# Patient Record
Sex: Male | Born: 1965 | Race: Black or African American | Hispanic: No | Smoking: Never smoker
Health system: Southern US, Community
[De-identification: ages and names within clinical notes are randomized; demographics above are authoritative.]

## PROBLEM LIST (undated history)

## (undated) DIAGNOSIS — I639 Cerebral infarction, unspecified: Secondary | ICD-10-CM

## (undated) DIAGNOSIS — E7800 Pure hypercholesterolemia, unspecified: Secondary | ICD-10-CM

---

## 2004-03-17 ENCOUNTER — Emergency Department (HOSPITAL_COMMUNITY): Admission: EM | Admit: 2004-03-17 | Discharge: 2004-03-18 | Payer: Self-pay | Admitting: Emergency Medicine

## 2004-07-19 ENCOUNTER — Ambulatory Visit: Payer: Self-pay | Admitting: Cardiology

## 2004-07-19 ENCOUNTER — Encounter: Payer: Self-pay | Admitting: Emergency Medicine

## 2004-07-19 ENCOUNTER — Inpatient Hospital Stay (HOSPITAL_COMMUNITY): Admission: AD | Admit: 2004-07-19 | Discharge: 2004-07-23 | Payer: Self-pay | Admitting: Neurology

## 2004-07-20 ENCOUNTER — Encounter: Payer: Self-pay | Admitting: Cardiology

## 2004-07-30 ENCOUNTER — Encounter: Admission: RE | Admit: 2004-07-30 | Discharge: 2004-09-06 | Payer: Self-pay | Admitting: Neurology

## 2005-07-11 ENCOUNTER — Emergency Department (HOSPITAL_COMMUNITY): Admission: EM | Admit: 2005-07-11 | Discharge: 2005-07-11 | Payer: Self-pay | Admitting: Family Medicine

## 2009-10-18 ENCOUNTER — Emergency Department (HOSPITAL_COMMUNITY): Admission: EM | Admit: 2009-10-18 | Discharge: 2009-10-18 | Payer: Self-pay | Admitting: Emergency Medicine

## 2010-11-09 NOTE — H&P (Signed)
NAMEGREGORY, Zavala NO.:  [id number]   MEDICAL RECORD NO.:  [id number]          PATIENT TYPE:  EMS   LOCATION:  ED                           FACILITY:  [facility]   PHYSICIAN:  Gustavus Messing. Orlin Hilding, M.D.DATE OF BIRTH:  Dec 20, 1965   DATE OF ADMISSION:  07/19/2004  DATE OF DISCHARGE:                                HISTORY & PHYSICAL   CHIEF COMPLAINT:  Confusion, speech disturbance.   HISTORY OF PRESENT ILLNESS:  [name] 45 year old, right-handed, black male  without any known medical problems. He woke up somewhere between 3 a.m. and  4 a.m. to go to the bathroom, but apparently urinated on the floor, fell a  few times and seemed confused.  I do not know whether his wife was clearly  aware of what was going on.  At any rate he went back to bed.  This morning  on awakening, he still seemed confused with shaking but not noted to be weak  particularly on one side or the other, although his wife felt his right face  was swollen.  He was eventually by family but not by EMS.  Not called as a  code stroke.   REVIEW OF SYSTEMS:  Denies headache, chest pain, shortness of breath, or  vision problems.   PAST MEDICAL HISTORY:  None known.   PAST SURGICAL HISTORY:  None.   MEDICATIONS:  None.   ALLERGIES:  No known drug allergies.   SOCIAL HISTORY:  He is married.  Works for [employer].  No cigarette use but  does use marijuana and alcohol.   FAMILY HISTORY:  Positive for stroke in his father.   PHYSICAL EXAMINATION:  VITAL SIGNS:  Temperature  98.4, pulse 70, blood  pressure 159/99, respirations 20, 98% saturation on room air.  HEENT:  Normocephalic, atraumatic.  NECK:  Supple without bruits.  HEART:  Regular rate and rhythm.  EXTREMITIES:  Without edema.  NEUROLOGIC:  Mental status:  He is awake and alert.  He is able to follow  most commands but not all.  He does demonstrate some apraxia.  He attempts  to speak.  He can get a few words out but there are a lot of  neologism and  paraphasic areas.  He really cannot communicate well.  He is unable to  repeat or name objects or name objects.  Cranial nerves:  Pupils are equal  and reactive.  Visual fields:  He cannot cooperate well but he definitely  blinks to the threat on the left but not on the right so I think he has a  right field cut.  Extraocular movements are intact, however.  Facial  sensation appears to be normal.  Facial motor activity demonstrates a right  central facial droop.  Hearing is intact.  Palate is symmetric and tongue is  midline. He is able to chew a cracker and swallow that with water without  choking or coughing.  Motor exam:  There is no drift in either upper  extremity or lower extremity.  He has good grip, good bulk, tone and  strength throughout, 5/5 strength, normal  rapid fine movements noted.  No  drift.  Reflexes are 2+ and symmetric.  Downgoing toes to plantar  stimulation.  Coordination:  Finger-to-nose, rapid alternating movements,  heel-to-shin are normal.  I did not have him ambulate. Sensory exam is  intact.   LABORATORY DATA:  CT scan shows hypodensity in the left insular region and  MRI confirms an acute stroke in the insular region on the left and MCA  branch.  MRA shows cutoff of the left MCA.  There is some ischemia in the  putamen and in the posterior limb of the internal capsule as well on that  side.  Labs thus far are unremarkable.  Urine drug screen was positive for  marijuana, negative for cocaine.  Normal CBC.   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  He has no other obvious risk factors.  He  has expressive greater than receptive aphasia, right field cut, right facial  weakness.  Etiology is unclear.   PLAN:  Since cardiac source is a possibility in this young man without other  medical history, I will  begin heparin pending outcome of work-up.  Admit to  stroke service, MRA of the neck, 2-D echocardiogram, transcranial Doppler,  SGOT, PT evaluation.      CAW/MEDQ  D:  07/19/2004  T:  07/19/2004  Job:  [id number]

## 2010-11-09 NOTE — Discharge Summary (Signed)
[name], [name]               ACCOUNT NO.:  [id number]   MEDICAL RECORD NO.:  [id number]          PATIENT TYPE:  INP   LOCATION:  3027                         FACILITY:  [facility]   PHYSICIAN:  Pramod P. Pearlean Brownie, MD    DATE OF BIRTH:  03/13/1966   DATE OF ADMISSION:  07/19/2004  DATE OF DISCHARGE:  07/23/2004                                 DISCHARGE SUMMARY   DISCHARGE DIAGNOSES:  1.  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  2.  Dyslipidemia.   DISCHARGE MEDICATIONS:  1.  Zocor 20 mg p.o. q. day.  2.  Aggrenox 1 p.o. q. day. x14 days, then increase to b.i.d.  3.  May take Tylenol 2 tablets 30 minutes prior to Aggrenox dose.   STUDIES PERFORMED:  1.  CT of the brain on admission shows subtle mild edema in the posterior      left frontal lobe, acute right maxillary sinusitis.  2.  MRI of the brain shows a left infracortex, nonhemorrhagic infarct.  3.  MRA of the brain:  Occluded distal left M1 segment MCA, hypoplastic left      vertebral artery, __________ origin, left PCA.  4.  Carotid Doppler shows no significant ICA stenosis.  Vertebral arteries      were antegrade.  5.  A 2D echocardiogram shows ejection fraction of 55-65% with no left      ventricular wall motion abnormalities.  6.  Transcranial Doppler performed.  Results pending.  7.  Transesophageal echocardiogram performed by Dr. Remington Bing showing      normal LV.  No valve abnormalities.  Normal left atrium.  No __________.      Nonthrombosed.  Normal aorta.  Contrast study negative.   LABORATORY STUDIES:  Hemoglobin A1C normal.  Homocysteine normal.  CBC  normal.  Sed rate normal.  Sickle cell screen negative.  Cholesterol 191,  triglycerides 82, HDL 71, LDL 104.   HISTORY OF PRESENT ILLNESS:  [name] is a 45 year old right-handed  black male with no significant medical history, who awoke at about 3:00 to  4:00 a.m. to go to the bathroom but urinated on the floor, fell a  few times,  then went back to bed.  That morning on awakening, he still seems confused  and was shaking but no weakness was noted.  His wife said his right face was  swollen, and he was brought to the emergency room by the family for  evaluation.  Neurology was called and felt that he had a stroke with aphasia  and was admitted to the hospital for evaluation.   HOSPITAL COURSE:  MRI did show an acute left infracortex infarct.  It was  felt to be embolic, although no etiology was found on hospital admission,  including a TEE.  A hypercoagulable workup was performed, except for studies  that would not be accurate, since the patient was on IV heparin.  Will need  to consider protein C and protein S, etc., in the future.  The patient did  have some very mild dyslipidemia with LDL of 104.  Was  placed on Zocor.  Otherwise, no other risk factors identified.  The most significant effect of  the stroke seems to be that of significant expressive aphasia and mild  receptive aphasia.  He will need outpatient speech therapy monitoring.   CONDITION ON DISCHARGE:  Patient is alert and oriented x 3.  Speech:  He has  significant expressive aphasia with word-finding difficulties with no  __________ repetition.  He has mild-to-moderate receptive aphasia.  He has  decreased ability to two-step commands.  His strength is normal.  Sensation  is intact.  No ataxia.  Chest is clear to auscultation.  Heart has a regular  rate and rhythm.   DISCHARGE PLAN:  1.  Discharge home with family.  2.  No working for now.  3.  Aggrenox for secondary stroke prevention.  4.  Outpatient speech therapy.  5.  Follow up cholesterol in 6-8 weeks.  6.  Get a primary physician to see them within a month.  7.  Follow up with Dr. Pearlean Brownie in two months.      SB/MEDQ  D:  07/23/2004  T:  07/23/2004  Job:  [id number]

## 2011-10-16 ENCOUNTER — Encounter (HOSPITAL_COMMUNITY): Payer: Self-pay | Admitting: *Deleted

## 2011-10-16 ENCOUNTER — Emergency Department (INDEPENDENT_AMBULATORY_CARE_PROVIDER_SITE_OTHER)
Admission: EM | Admit: 2011-10-16 | Discharge: 2011-10-16 | Disposition: A | Discharge status: Home / Self Care | Payer: BC Managed Care – PPO | Source: Home / Self Care | Attending: Emergency Medicine | Admitting: Emergency Medicine

## 2011-10-16 DIAGNOSIS — B009 Herpesviral infection, unspecified: Secondary | ICD-10-CM

## 2011-10-16 DIAGNOSIS — B001 Herpesviral vesicular dermatitis: Secondary | ICD-10-CM

## 2011-10-16 HISTORY — DX: Cerebral infarction, unspecified: I63.9

## 2011-10-16 MED ORDER — ACYCLOVIR 400 MG PO TABS: 400.0000 mg | ORAL_TABLET | Freq: Three times a day (TID) | ORAL | Status: AC

## 2011-10-16 MED ORDER — DOCOSANOL 10 % EX CREA: TOPICAL_CREAM | CUTANEOUS | Status: DC

## 2011-10-16 NOTE — ED Provider Notes (Signed)
History     CSN: [id number]  Arrival date & time 10/16/11  1134   First MD Initiated Contact with Patient 10/16/11 1252      Chief Complaint  Patient presents with  . Mouth Lesions    (Consider location/radiation/quality/duration/timing/severity/associated sxs/prior treatment) HPI Comments: Rash tender and mildly itchy on R upper lip, have had this before but a long time ago" I alos wan to be tested for HIV  Patient is a 46 y.o. male presenting with mouth sores. The history is provided by the patient.  Mouth Lesions  The current episode started 2 days ago. The problem occurs rarely. The problem has been gradually worsening. The problem is mild. The symptoms are relieved by nothing. Associated symptoms include mouth sores. Pertinent negatives include no fever, no sore throat, no swollen glands, no muscle aches, no cough and no URI.    Past Medical History  Diagnosis Date  . Stroke     History reviewed. No pertinent past surgical history.  No family history on file.  History  Substance Use Topics  . Smoking status: Never Smoker   . Smokeless tobacco: Not on file  . Alcohol Use: Yes      Review of Systems  Constitutional: Negative for fever and chills.  HENT: Positive for mouth sores. Negative for sore throat.   Respiratory: Negative for cough.     Allergies  Review of patient's allergies indicates not on file.  Home Medications   Current Outpatient Rx  Name Route Sig Dispense Refill  . SIMVASTATIN PO Oral Take by mouth every morning.    . ACYCLOVIR 400 MG PO TABS Oral Take 1 tablet (400 mg total) by mouth 3 (three) times daily. 50 tablet 3  . DOCOSANOL 10 % EX CREA  Apply up to 5 times per day- maximun 10 days 2 g 0    BP 114/81  Pulse 68  Temp(Src) 98.2 F (36.8 C) (Oral)  Resp 18  SpO2 99%  Physical Exam  Nursing note and vitals reviewed. Constitutional: He appears well-developed and well-nourished.  HENT:  Head: Normocephalic.  Mouth/Throat:  Uvula is midline, oropharynx is clear and moist and mucous membranes are normal. No oropharyngeal exudate.    Eyes: Conjunctivae are normal. Right eye exhibits no discharge. Left eye exhibits no discharge.  Lymphadenopathy:    He has no cervical adenopathy.  Psychiatric: He has a normal mood and affect. His behavior is normal. Judgment and thought content normal.    ED Course  Procedures (including critical care time)   Labs Reviewed  HIV ANTIBODY (ROUTINE TESTING)  RPR   No results found.   1. Herpes labialis       MDM  Upper lip vesicular-erythematous eruption (not first episode per patient) Patient explains also wants to be tested for HIV        Jimmie Molly, MD 10/16/11 1756

## 2011-10-16 NOTE — Discharge Instructions (Signed)
Herpes Labialis  You have a fever blister or cold sore (herpes labialis). These painful, grouped sores are caused by one of the herpes viruses (HSV1 most commonly). They are usually found around the lips and mouth, but the same infection can also affect other areas on the face such as the nose and eyes. Herpes infections take about 10 days to heal. They often occur again and again in the same spot. Other symptoms may include numbness and tingling in the involved skin, achiness, fever, and swollen glands in the neck. Colds, emotional stress, injuries, or excess sunlight exposure all seem to make herpes reappear. Herpes lip infections are contagious. Direct contact with these sores can spread the infection. It can also be spread to other parts of your own body.  TREATMENT   Herpes labialis is usually self-limited and resolves within 1 week. To reduce pain and swelling, apply ice packs frequently to the sores or suck on popsicles or frozen juice bars. Antiviral medicine may be used by mouth to shorten the duration of the breakout. Avoid spreading the infection by washing your hands often. Be careful not to touch your eyes or genital areas after handling the infected blisters. Do not kiss or have other intimate contact with others. After the blisters are completely healed you may resume contact. Use sunscreen to lessen recurrences.   If this is your first infection with herpes, or if you have a severe or repeated infections, your caregiver may prescribe one of the anti-viral drugs to speed up the healing. If you have sun-related flare-ups despite the use of sunscreen, starting oral anti-viral medicine before a prolonged exposure (going skiing or to the beach) can prevent most episodes.   SEEK IMMEDIATE MEDICAL CARE IF:   You develop a headache, sleepiness, high fever, vomiting, or severe weakness.   You have eye irritation, pain, blurred vision or redness.   You develop a prolonged infection not getting better in 10  days.  Document Released: 06/10/2005 Document Revised: 05/30/2011 Document Reviewed: 04/14/2009  [employer] Patient Information 2012 [employer].

## 2011-10-16 NOTE — ED Notes (Signed)
Pt  Has  A  Lesion   On  His  r  Lip   X  2  Days  He  Reports   He  Woke  Up  With the  Symptom        denys  Any other  Symptoms

## 2011-10-30 ENCOUNTER — Telehealth (HOSPITAL_COMMUNITY): Payer: Self-pay | Admitting: *Deleted

## 2011-10-30 NOTE — ED Notes (Signed)
Pt. called for his lab results. Pt. verified x 2 and given neg. RPR result. Pt. told he would have to come between 1130-1800 M-F to see HIV result. Vassie Moselle 10/30/2011

## 2012-01-26 ENCOUNTER — Emergency Department (HOSPITAL_COMMUNITY): Payer: BC Managed Care – PPO

## 2012-01-26 ENCOUNTER — Emergency Department (HOSPITAL_COMMUNITY)
Admission: EM | Admit: 2012-01-26 | Discharge: 2012-01-26 | Disposition: A | Discharge status: Home / Self Care | Payer: BC Managed Care – PPO | Attending: Emergency Medicine | Admitting: Emergency Medicine

## 2012-01-26 ENCOUNTER — Encounter (HOSPITAL_COMMUNITY): Payer: Self-pay | Admitting: Emergency Medicine

## 2012-01-26 ENCOUNTER — Emergency Department (INDEPENDENT_AMBULATORY_CARE_PROVIDER_SITE_OTHER)
Admission: EM | Admit: 2012-01-26 | Discharge: 2012-01-26 | Disposition: A | Discharge status: Nursing Home (Includes ICF) | Payer: BC Managed Care – PPO | Source: Home / Self Care | Attending: Emergency Medicine | Admitting: Emergency Medicine

## 2012-01-26 ENCOUNTER — Encounter (HOSPITAL_COMMUNITY): Payer: Self-pay | Admitting: *Deleted

## 2012-01-26 DIAGNOSIS — I69998 Other sequelae following unspecified cerebrovascular disease: Secondary | ICD-10-CM | POA: Insufficient documentation

## 2012-01-26 DIAGNOSIS — R200 Anesthesia of skin: Secondary | ICD-10-CM

## 2012-01-26 DIAGNOSIS — G459 Transient cerebral ischemic attack, unspecified: Secondary | ICD-10-CM

## 2012-01-26 DIAGNOSIS — R209 Unspecified disturbances of skin sensation: Secondary | ICD-10-CM | POA: Insufficient documentation

## 2012-01-26 HISTORY — DX: Pure hypercholesterolemia, unspecified: E78.00

## 2012-01-26 LAB — COMPREHENSIVE METABOLIC PANEL
ALT: 14 U/L (ref 0–53)
Albumin: 3.9 g/dL (ref 3.5–5.2)
Alkaline Phosphatase: 57 U/L (ref 39–117)
BUN: 15 mg/dL (ref 6–23)
Chloride: 101 mEq/L (ref 96–112)
Glucose, Bld: 85 mg/dL (ref 70–99)
Potassium: 4.5 mEq/L (ref 3.5–5.1)
Total Bilirubin: 0.6 mg/dL (ref 0.3–1.2)

## 2012-01-26 LAB — CBC WITH DIFFERENTIAL/PLATELET
Basophils Relative: 0 % (ref 0–1)
Hemoglobin: 16.6 g/dL (ref 13.0–17.0)
Lymphs Abs: 1.7 10*3/uL (ref 0.7–4.0)
Monocytes Relative: 9 % (ref 3–12)
Neutro Abs: 1.3 10*3/uL — ABNORMAL LOW (ref 1.7–7.7)
Neutrophils Relative %: 38 % — ABNORMAL LOW (ref 43–77)
RBC: 5.16 MIL/uL (ref 4.22–5.81)

## 2012-01-26 LAB — URINALYSIS, ROUTINE W REFLEX MICROSCOPIC
Glucose, UA: NEGATIVE mg/dL
Hgb urine dipstick: NEGATIVE
Protein, ur: NEGATIVE mg/dL
pH: 5 (ref 5.0–8.0)

## 2012-01-26 LAB — PROTIME-INR: Prothrombin Time: 13.9 seconds (ref 11.6–15.2)

## 2012-01-26 NOTE — ED Notes (Signed)
Patient transported to CT 

## 2012-01-26 NOTE — ED Notes (Signed)
Went to discharge the patient and the patient was already gone.

## 2012-01-26 NOTE — ED Notes (Signed)
Back from Xray.

## 2012-01-26 NOTE — ED Notes (Signed)
Pt with c/o intermittent numbness left side of face x 2 weeks  - previous of stroke - denies numbness or tingling at this time

## 2012-01-26 NOTE — ED Notes (Signed)
Patient advises that he is having intermittent numbness and tingling sensation over the left cheek and the left side of the neck front and back. Denies pain. Patient unable to lift the right leg up as well patient advises that is related to a basketball injury.

## 2012-01-26 NOTE — ED Notes (Signed)
Pt sent here from [facility] for intermittent numbness/tingling sensation to right side of face x 2 weeks. Has hx of cva. Denies numbness at this time and no neuro deficits are noted a triage.

## 2012-01-26 NOTE — ED Provider Notes (Signed)
History     CSN: [id number]  Arrival date & time 01/26/12  1209   First MD Initiated Contact with Patient 01/26/12 1307      Chief Complaint  Patient presents with  . Numbness    (Consider location/radiation/quality/duration/timing/severity/associated sxs/prior treatment) HPI Comments: Is a 46 year old man who had a stroke in 2001-1/2 tingling last 15-30 seconds and go away. Just in the problem list paralysis, no difficulty with speech. He takes an aspirin every day for Stroke prophylaxis.  Patient is a 47 y.o. male presenting with neurologic complaint. The history is provided by the patient and medical records. No language interpreter was used.  Neurologic Problem Primary symptoms comment: Numbness and tingling in the left side of the face and neck. The symptoms began more than 1 week ago. The episode lasted 30 seconds. The symptoms are unchanged. The neurological symptoms are focal. Context: Prior stroke in 2006.  Associated symptoms comments: None.. Workup history includes MRI.    Past Medical History  Diagnosis Date  . Stroke   . High cholesterol     History reviewed. No pertinent past surgical history.  History reviewed. No pertinent family history.  History  Substance Use Topics  . Smoking status: Never Smoker   . Smokeless tobacco: Not on file  . Alcohol Use: Yes      Review of Systems  All other systems reviewed and are negative.    Allergies  Review of patient's allergies indicates no known allergies.  Home Medications   Current Outpatient Rx  Name Route Sig Dispense Refill  . ASPIRIN 325 MG PO TBEC Oral Take 325 mg by mouth daily.    . ADULT MULTIVITAMIN W/MINERALS CH Oral Take 1 tablet by mouth daily.      BP 131/88  Pulse 80  Temp 98.2 F (36.8 C) (Oral)  Resp 18  SpO2 100%  Physical Exam  Nursing note and vitals reviewed. Constitutional: He is oriented to person, place, and time. He appears well-developed and well-nourished. No distress.    HENT:  Head: Normocephalic and atraumatic.  Right Ear: External ear normal.  Left Ear: External ear normal.  Mouth/Throat: Oropharynx is clear and moist.  Eyes: Conjunctivae and EOM are normal. Pupils are equal, round, and reactive to light.  Neck: Normal range of motion. Neck supple.  Cardiovascular: Normal rate, regular rhythm and normal heart sounds.   Pulmonary/Chest: Effort normal and breath sounds normal.  Abdominal: Soft. Bowel sounds are normal.  Musculoskeletal: Normal range of motion. He exhibits no edema and no tenderness.  Neurological: He is alert and oriented to person, place, and time.       No sensory or motor deficit.  Skin: Skin is warm and dry.  Psychiatric: He has a normal mood and affect. His behavior is normal.    ED Course  Procedures (including critical care time)  Labs Reviewed  CBC WITH DIFFERENTIAL - Abnormal; Notable for the following:    WBC 3.5 (*)     MCHC 36.5 (*)     Platelets 105 (*)  REPEATED TO VERIFY   Neutrophils Relative 38 (*)     Neutro Abs 1.3 (*)     Lymphocytes Relative 48 (*)     All other components within normal limits  COMPREHENSIVE METABOLIC PANEL - Abnormal; Notable for the following:    GFR calc non Af Amer 77 (*)     GFR calc Af Amer 89 (*)     All other components within normal limits  PROTIME-INR  APTT  URINALYSIS, ROUTINE W REFLEX MICROSCOPIC   Mr Brain Wo Contrast  01/26/2012  *RADIOLOGY REPORT*  Clinical Data: Numbness.  Episodes of left facial tingling over the last week.  Prior history of stroke.  MRI HEAD WITHOUT CONTRAST  Technique:  Multiplanar, multiecho pulse sequences of the brain and surrounding structures were obtained according to standard protocol without intravenous contrast.  Comparison: MRI brain 07/19/2004.  Findings: The diffusion weighted images demonstrate no evidence for acute or subacute infarction.  Remote left MCA encephalomalacia is noted.  Remote lacunar infarcts are present in the left putamen  and caudate head.  These all represent the areas of acute infarction evident in 2006.  No hemorrhage or mass lesion is present.  The ventricles are of normal size.  No significant extra-axial fluid collection is present.  Flow is present in the major intracranial arteries.  IMPRESSION:  1.  No acute intracranial abnormality or focal lesion to explain the patient's symptoms. 2.  Evolution and encephalomalacia of the previously seen left MCA territory infarcts with volume loss. 3.  Minimal left ethmoid sinus disease.  Original Report Authenticated By: Jamesetta Orleans. MATTERN, M.D.   Lab and MRI were negative.  Reassured and released.  1. Numbness          Carleene Cooper III, MD 01/26/12 [phone]

## 2012-01-26 NOTE — ED Provider Notes (Signed)
Chief Complaint  Patient presents with  . Numbness    History of Present Illness:   [name] is a 46 year old male who has had a two-week history of intermittent episodes of numbness of the entire left side of the face. This begins in the left posterior neck and spreads to the cheek area and to the jaw and down the anterior neck. Episodes may occur dozens of times per day and lasts from 15-30 seconds at a time. They're not painful not accompanied by any weakness. He denies any headache, stiff neck, diplopia, blurred vision. He's had no numbness or weakness of his arms or legs and no difficulty walking or speaking. He does have a history of a stroke 5 or 6 years ago on his left side for which she was hospitalized and underwent physical therapy. Right now he is on simvastatin for elevated cholesterol and takes an aspirin a day. He denies any cardiac symptoms or history to fibrillation or coronary artery disease.  Review of Systems:  Other than noted above, the patient denies any of the following symptoms: Systemic:  No fever, chills, fatigue, photophobia, stiff neck. Eye:  No redness, eye pain, discharge, blurred vision, or diplopia. ENT:  No nasal congestion, rhinorrhea, sinus pressure or pain, sneezing, earache, or sore throat.  No jaw claudication. Neuro:  No paresthesias, loss of consciousness, seizure activity, muscle weakness, trouble with coordination or gait, trouble speaking or swallowing. Psych:  No depression, anxiety or trouble sleeping.  PMFSH:  Past medical history, family history, social history, meds, and allergies were reviewed.  Physical Exam:   Vital signs:  BP 100/70  Pulse 64  Temp 98.2 F (36.8 C) (Oral)  Resp 18  SpO2 100% General:  Alert and oriented.  In no distress. Eye:  Lids and conjunctivas normal.  PERRL,  Full EOMs.  Fundi benign with normal discs and vessels. ENT:  No cranial or facial tenderness to palpation.  TMs and canals clear.  Nasal mucosa was normal and  uncongested without any drainage. No intra oral lesions, pharynx clear, mucous membranes moist, dentition normal. Neck:  Supple, full ROM, no tenderness to palpation.  No adenopathy or mass. No carotid bruit. Lungs: Clear to auscultation. Heart: Regular rhythm, no gallop or murmur. Neuro:  Alert and orented times 3.  Speech was clear, fluent, and appropriate.  Cranial nerves intact. No pronator drift, muscle strength normal. Finger to nose normal.  DTRs were 2+ and symmetrical.Station and gait were normal.  Romberg's sign was normal.  Able to perform tandem gait well. Psych:  Normal affect.  Assessment:  The encounter diagnosis was TIA (transient ischemic attack).  Plan:   1.  The following meds were prescribed:   New Prescriptions   No medications on file   2.  The patient was transferred to the emergency department via shuttle for workup of possible TIAs.   Reuben Likes, MD 01/26/12 [phone]

## 2012-01-26 NOTE — ED Notes (Signed)
Lab in to obtain blood

## 2014-06-10 ENCOUNTER — Ambulatory Visit
Admission: RE | Admit: 2014-06-10 | Discharge: 2014-06-10 | Disposition: A | Discharge status: Home / Self Care | Payer: BC Managed Care – PPO | Source: Ambulatory Visit | Attending: Medical | Admitting: Medical

## 2014-06-10 ENCOUNTER — Ambulatory Visit (INDEPENDENT_AMBULATORY_CARE_PROVIDER_SITE_OTHER): Payer: BC Managed Care – PPO | Admitting: Medical

## 2014-06-10 ENCOUNTER — Encounter: Payer: Self-pay | Admitting: Medical

## 2014-06-10 VITALS — BP 116/72 | HR 68 | Temp 98.1°F | Resp 15 | Ht 77.0 in | Wt 151.0 lb

## 2014-06-10 DIAGNOSIS — R29898 Other symptoms and signs involving the musculoskeletal system: Secondary | ICD-10-CM

## 2014-06-10 DIAGNOSIS — G8929 Other chronic pain: Secondary | ICD-10-CM

## 2014-06-10 DIAGNOSIS — M25552 Pain in left hip: Secondary | ICD-10-CM

## 2014-06-10 DIAGNOSIS — M549 Dorsalgia, unspecified: Principal | ICD-10-CM

## 2014-06-10 NOTE — Progress Notes (Signed)
Subjective: Here as a new patient today.  Was seeing [facility] before.   Here today for c/o back and leg.  Been having pain the last year or more, but worse in the last month.  Pain is in low back, radiates down the leg.  No numbness, no tingling.   Feels weak in upper leg on the right.  No falls or tripping.  No prior back surgery.  No prior back specialist. Using OTC Aleve or Ibuprofen and at night.  No fever, no night sweats, no recent weight changes. Always has been light weight for height.  No blood in stool or urine.  No abdominal pain.  No hx/o injury or fall.     No other aggravating or relieving factors. No other complaint.  Objective: BP 116/72 mmHg  Pulse 68  Temp(Src) 98.1 F (36.7 C) (Oral)  Resp 15  Ht 6\' 5"  (1.956 m)  Wt 151 lb (68.493 kg)  BMI 17.90 kg/m2  Gen: wd, wn, nad, lean AA male Skin unremarkable Abdomen: Positive bowel sounds, soft, Nontender, no mass, no hepatosplenomegaly Back: Right SI joint seems to have bony projection, otherwise back nontender, mild pain with flexion and extension although range of motion seems fairly normal, no scoliosis Musculoskeletal: Left leg nontender, normal range of motion, right leg - guarded with hip range of motion which is limited and causes pain, otherwise leg nontender, unremarkable range of motion knee and ankle No extremity edema Pulses normal Right leg seems to be decreased strength compared to the left but still 4-5/5, reflexes are increased on the right leg, sensation normal in general   Assessment: Encounter Diagnoses  Name Primary?  . Chronic back pain Yes  . Hip pain, left   . Right leg weakness    Plan: We discussed his concerns and findings, will send for x-rays of lumbar spine and right hip. Can use over-the-counter Aleve when necessary.  Advised he come back a later date for routine follow-up/physical given history of hypertension and stroke

## 2014-06-13 ENCOUNTER — Other Ambulatory Visit: Payer: Self-pay | Admitting: Family Medicine

## 2014-06-13 DIAGNOSIS — M25552 Pain in left hip: Secondary | ICD-10-CM

## 2014-06-14 ENCOUNTER — Other Ambulatory Visit: Payer: Self-pay | Admitting: Family Medicine

## 2014-06-14 DIAGNOSIS — M25552 Pain in left hip: Secondary | ICD-10-CM

## 2014-09-26 ENCOUNTER — Other Ambulatory Visit (HOSPITAL_COMMUNITY): Payer: Self-pay | Admitting: Orthopedic Surgery

## 2014-10-07 ENCOUNTER — Inpatient Hospital Stay: Admit: 2014-10-07 | Payer: Self-pay | Admitting: Orthopedic Surgery

## 2014-10-07 SURGERY — ARTHROPLASTY, HIP, TOTAL, ANTERIOR APPROACH
Anesthesia: General | Site: Hip | Laterality: Right

## 2014-12-13 ENCOUNTER — Inpatient Hospital Stay: Admission: RE | Admit: 2014-12-13 | Payer: Self-pay | Source: Ambulatory Visit | Admitting: Orthopedic Surgery

## 2014-12-13 ENCOUNTER — Encounter: Admission: RE | Payer: Self-pay | Source: Ambulatory Visit

## 2014-12-13 SURGERY — ARTHROPLASTY, HIP, TOTAL, ANTERIOR APPROACH
Anesthesia: Choice | Site: Hip | Laterality: Right

## 2017-09-11 ENCOUNTER — Other Ambulatory Visit: Payer: Self-pay | Admitting: Orthopedic Surgery

## 2017-09-11 DIAGNOSIS — M25551 Pain in right hip: Secondary | ICD-10-CM

## 2017-09-23 ENCOUNTER — Telehealth: Payer: Self-pay

## 2017-09-23 NOTE — Telephone Encounter (Signed)
Kelly at [employer] [facility] [employer] wants to know if provider can see pt as new pt and do surgical clearance for total hip replacement.Please advise. Claiborne Billings request cb ASAP.

## 2017-09-23 NOTE — Telephone Encounter (Signed)
Yes okay to schedule during next 30 minute slot at his convenience.

## 2017-09-24 ENCOUNTER — Other Ambulatory Visit: Payer: Self-pay

## 2017-10-02 ENCOUNTER — Telehealth: Payer: Self-pay

## 2017-10-02 ENCOUNTER — Ambulatory Visit (INDEPENDENT_AMBULATORY_CARE_PROVIDER_SITE_OTHER): Payer: Managed Care, Other (non HMO) | Admitting: Neurology

## 2017-10-02 ENCOUNTER — Encounter: Payer: Self-pay | Admitting: Neurology

## 2017-10-02 VITALS — BP 139/89 | HR 78 | Ht 78.0 in | Wt 150.0 lb

## 2017-10-02 DIAGNOSIS — I6990 Unspecified sequelae of unspecified cerebrovascular disease: Secondary | ICD-10-CM | POA: Diagnosis not present

## 2017-10-02 NOTE — Patient Instructions (Addendum)
I had a long d/w patient about his remote crytogenic stroke, risk for recurrent stroke/TIAs, personally independently reviewed imaging studies and stroke evaluation results and answered questions.I strongly encouraged him to start taking aspirin 81 mg daily  for secondary stroke prevention and maintain strict control of hypertension with blood pressure goal below 130/90, diabetes with hemoglobin A1c goal below 6.5% and lipids with LDL cholesterol goal below 70 mg/dL. I also advised the patient to eat a healthy diet with plenty of whole grains, cereals, fruits and vegetables, exercise regularly and maintain ideal body weight.check lipid profile and hemoglobin A1c. Patient is neurologically cleared for hip surgery has is not had a stroke in the last 13 years. Followup in the future with me only as necessary and no routine scheduled appointment was made.

## 2017-10-02 NOTE — Progress Notes (Signed)
[facility] [address] [place]. [place] [address] [phone]       OFFICE CONSULT NOTE  [name] Date of Birth:  [birth year] Medical Record Number:  [id number]   Referring MD:  [name]  Reason for Referral:  Neurological clearance for hip surgery HPI: [name] is a 52 year old pleasant African-American male who was referred for neurological clearance for hip surgery. He had a left MCA branch infarct in 2006 and was admitted with mild aphasia and right-sided weakness. I have reviewed some available neurological records from Dr. Marney Zavala from that admission. MRI showed left MCA frontal branch infarct in MRA of the brain showed left distal middle cerebral artery occlusion and MRA of the neck was unremarkable. Transthoracic echo had shown normal ejection fraction. Cardiac monitoring apparently during hospitalization was negative. It is unclear whether the patient had a transesophageal echocardiogram or prolonged outpatient cardiac monitoring done.e denied abusing drugs at that time but may have been smoking marijuana which he has since quit Patient was supposed to be on aspirin but is states is not been taking it regularly. He takes it probably 3 or 4 days a week. He was also supposed to be on lipid lowering medications but presently his not taking it either. He states his made for neurological recovery speech and language improved and he has good strength. His been mainly bothered by significant right hip pain due to degenerative arthritis. He can barely walk now as he has trouble bending his head. He plans on having hip surgery done by Dr. French Zavala and is here to see me for neurological clearance. Review of electronic medical records does not show any recent brain imaging studies are recent labwork pertinent to stroke care. He denies any recurrent symptoms of stroke or TIA following his initial stroke in 2006.  ROS:   14 system review of systems is positive for   Right hip pain, walking difficulty, urination problems, cramps, not enough sleep, restless legs, itching and all the systems negative PMH:  Past Medical History:  Diagnosis Date  . High cholesterol   . Stroke [facility])     Social History:  Social History   Socioeconomic History  . Marital status: Married    Spouse name: Not on file  . Number of children: Not on file  . Years of education: Not on file  . Highest education level: Not on file  Occupational History  . Not on file  Social Needs  . Financial resource strain: Not on file  . Food insecurity:    Worry: Not on file    Inability: Not on file  . Transportation needs:    Medical: Not on file    Non-medical: Not on file  Tobacco Use  . Smoking status: Never Smoker  . Smokeless tobacco: Never Used  Substance and Sexual Activity  . Alcohol use: Yes  . Drug use: Not Currently  . Sexual activity: Not on file  Lifestyle  . Physical activity:    Days per week: Not on file    Minutes per session: Not on file  . Stress: Not on file  Relationships  . Social connections:    Talks on phone: Not on file    Gets together: Not on file    Attends religious service: Not on file    Active member of club or organization: Not on file    Attends meetings of clubs or organizations: Not on file    Relationship status: Not on file  .  Intimate partner violence:    Fear of current or ex partner: Not on file    Emotionally abused: Not on file    Physically abused: Not on file    Forced sexual activity: Not on file  Other Topics Concern  . Not on file  Social History Narrative  . Not on file    Medications:   Current Outpatient Medications on File Prior to Visit  Medication Sig Dispense Refill  . aspirin 325 MG EC tablet Take 325 mg by mouth daily.    Marland Kitchen ibuprofen (ADVIL,MOTRIN) 200 MG tablet Take 200 mg by mouth every 6 (six) hours as needed.    . Multiple Vitamin (MULTIVITAMIN WITH MINERALS) TABS Take 1 tablet by mouth daily.       No current facility-administered medications on file prior to visit.     Allergies:  No Known Allergies  Physical Exam General: well developed, well nourished middle-aged African-American male, seated, in no evident distress Head: head normocephalic and atraumatic.   Neck: supple with no carotid or supraclavicular bruits Cardiovascular: regular rate and rhythm, no murmurs Musculoskeletal: right hip movements limited due to pain Skin:  no rash/petichiae Vascular:  Normal pulses all extremities  Neurologic Exam Mental Status: Awake and fully alert. Oriented to place and time. Recent and remote memory intact. Attention span, concentration and fund of knowledge appropriate. Mood and affect appropriate.  Cranial Nerves: Fundoscopic exam reveals sharp disc margins. Pupils equal, briskly reactive to light. Extraocular movements full without nystagmus. Visual fields full to confrontation. Hearing intact. Facial sensation intact. Face, tongue, palate moves normally and symmetrically.  Motor: Normal bulk and tone. Normal strength in all tested extremity muscles except right hip testing limited due to pain. Sensory.: intact to touch , pinprick , position and vibratory sensation.  Coordination: Rapid alternating movements normal in all extremities. Finger-to-nose and heel-to-shin performed accurately bilaterally. Gait and Station: Arises from chair without difficulty. Stance is leaning to the left favoring his right hip. Walks with an antalgic gait with favoring the right hip. Unable to walk tandem or stand on right foot unsupported Reflexes: 1+ and symmetric. Toes downgoing.   NIHSS  0 Modified Rankin  1   ASSESSMENT: 52 year old African-American male with cryptogenic left MCA branch infarct in 2006 with excellent clinical recovery with no neurological residue.Vascular risk factors of hyperlipidemia and prior stroke only    PLAN: I had a long d/w patient about his remote crytogenic stroke,  risk for recurrent stroke/TIAs, personally independently reviewed imaging studies and stroke evaluation results and answered questions.I strongly encouraged him to start taking aspirin 81 mg daily  for secondary stroke prevention and maintain strict control of hypertension with blood pressure goal below 130/90, diabetes with hemoglobin A1c goal below 6.5% and lipids with LDL cholesterol goal below 70 mg/dL. I also advised the patient to eat a healthy diet with plenty of whole grains, cereals, fruits and vegetables, exercise regularly and maintain ideal body weight.check lipid profile and hemoglobin A1c. Patient is neurologically cleared for hip surgery has is not had a stroke in the last 13 years. Greater than 50% time during this 45 minute consultation visit was spent on counseling and coordination of care about his remote stroke and discussion aboutstroke prevention and neurological clearance for surgery and answering questions.Followup in the future with me only as necessary and no routine scheduled appointment was made. Antony Contras, MD  [facility] [address] [place], [place] [address]  Phone [phone] Fax [phone] Note: This document  was prepared with digital dictation and possible smart phrase technology. Any transcriptional errors that result from this process are unintentional.

## 2017-10-02 NOTE — Telephone Encounter (Signed)
Fax clearance form to [employer] twice to [phone]. Fax receive and confirmed.

## 2017-10-03 LAB — LIPID PANEL
CHOL/HDL RATIO: 2 ratio (ref 0.0–5.0)
Cholesterol, Total: 201 mg/dL — ABNORMAL HIGH (ref 100–199)
HDL: 99 mg/dL (ref >39)
LDL CALC: 91 mg/dL (ref 0–99)
Triglycerides: 57 mg/dL (ref 0–149)
VLDL CHOLESTEROL CAL: 11 mg/dL (ref 5–40)

## 2017-10-03 LAB — HEMOGLOBIN A1C
Est. average glucose Bld gHb Est-mCnc: 94 mg/dL
HEMOGLOBIN A1C: 4.9 % (ref 4.8–5.6)

## 2017-10-16 ENCOUNTER — Telehealth: Payer: Self-pay

## 2017-10-16 NOTE — Telephone Encounter (Signed)
-----   Message from Garvin Fila, MD sent at 10/16/2017  8:12 AM EDT ----- Mitchell Heir inform the patient that blood work for cholesterol and screening for diabetes were both satisfactory

## 2017-10-16 NOTE — Telephone Encounter (Signed)
Rn call patient about lab work. Rn stated the lipid panel for cholesterol and diabetes were satisfactory. Pt verbalized understanding.

## 2017-10-17 ENCOUNTER — Ambulatory Visit (INDEPENDENT_AMBULATORY_CARE_PROVIDER_SITE_OTHER): Payer: Managed Care, Other (non HMO) | Admitting: Family Medicine

## 2017-10-17 ENCOUNTER — Encounter: Payer: Self-pay | Admitting: Family Medicine

## 2017-10-17 VITALS — BP 120/70 | HR 82 | Temp 98.1°F | Ht 75.0 in | Wt 146.5 lb

## 2017-10-17 DIAGNOSIS — Z01818 Encounter for other preprocedural examination: Secondary | ICD-10-CM | POA: Diagnosis not present

## 2017-10-17 NOTE — Progress Notes (Signed)
HPI: Patient is a 52 year old male with past medical history significant for arthritis, history of stroke.  Patient is seen today for establish care and presurgical visit.  Patient states he is hoping to have right total hip replacement.  Patient endorses taking Tylenol and aspirin as needed for his hip pain.  History of stroke: -Patient had a stroke 13 years ago -Patient thinks it may have been contributed to elevated blood pressure at that time. -Patient denies current HTN or use of HTN medication. -was on a statin, but has not taken in years -Patient states he was recently seen by neurology.  At that time lipid panel and hemoglobin A 1C were done.  Allergies:   Past surgical history: None  Social history: Patient is married.  Is employed as a Advertising account planner.  Patient endorses alcohol use, drinks 6 pack/day.  Patient denies tremors he does not drink.  Patient states he occasionally does not drink when he cannot afford it.  Patient denies tobacco and drug use.  Family medical history: Mom-arthritis, thyroid cancer, tobacco use Dad-arthritis, MI Sister-Lynn, arthritis, thyroid cancer  Patient is seen for optimization of general medical care prior to surgery. Surgery type: R total hip replacement Date of surgery: TBD  Kidney disease? denies Prior surgeries/Issues following anesthesia? none Hx MI, heart arrythmia, CHF, angina or stroke? H/o stroke 13 yrs ago Epilepsy or Seizures? none Arthritis or problems with neck or jaw? Arthritis in hip Liver disease? denies Asthma, COPD or chronic lung disease? none Diabetes? none (Needs to be evaluated by anesthesia if yes to these questions.)  Other: Poor nutrition, Frail or other: thin.  BMI 18.3= underweight  METS:  ?Can take care of self, such as eat, dress, or use the toilet (1 MET). yes ?Can walk up a flight of steps or a hill (4 METs).yes ?Can do heavy work around the house such as scrubbing floors or lifting or moving heavy  furniture (between 4 and 10 METs). yes ?Can participate in strenuous sports such as swimming, singles tennis, football, basketball, and skiing (>10 METs) . AHA Risks: Major predictors that require intensive management and may lead to delay in or cancellation of the operative procedure unless emergent: NONE  . Unstable coronary syndromes including unstable or severe angina or recent MI  . Decompensated heart failure including NYHA functional class IV or worsening or new-onset HF  . Significant arrhythmias including high grade AV block, symptomatic ventricular arrhythmias, supraventricular arrhythmias with ventricular rate >100 bpm at rest, symptomatic bradycardia, and newly recognized ventricular tachycardia  . Severe heart valve disease including severe aortic stenosis or symptomatic mitral stenosis   Other clinical predictors that warrant careful assessment of current status:  H/o stroke  . History of ischemic heart disease . History of cerebrovascular disease  . History of compensated heart failure or prior heart failure  . Diabetes mellitus  . Renal insufficiency  Type of surgery and Risk: 1) High risk (reported risk of cardiac death or nonfatal myocardial infarction [MI] often greater than 5 percent):  Marland Kitchen Aortic and other major vascular surgery  . Peripheral artery surgery   2)Intermediate risk (reported risk of cardiac death or nonfatal MI generally 1 to 5 percent):  Marland Kitchen Carotid endarterectomy  . Head and neck surgery  . Intraperitoneal and intrathoracic surgery  . Orthopedic surgery  . Prostate surgery   3)Low risk (reported risk of cardiac death or nonfatal MI generally less than 1 percent):  Marland Kitchen Ambulatory surgery  . Endoscopic procedures  . Superficial  procedure  . Cataract surgery  . Breast surgery  Medications that need to be addressed prior to surgery: None Discontinue acei/arbs/non-statin lipid lowering drugs day of surgery ASA stop 7 days before or discuss with  cardiology if CV risks, other anticoagulants discuss with cardiology.   ROS: See pertinent positives and negatives per HPI. 11 point ROS negative except where noted.  + R hip pain  Past Medical History:  Diagnosis Date  . High cholesterol   . Stroke [facility])     History reviewed. No pertinent surgical history.  History reviewed. No pertinent family history.  Social History   Socioeconomic History  . Marital status: Married    Spouse name: Not on file  . Number of children: Not on file  . Years of education: Not on file  . Highest education level: Not on file  Occupational History  . Not on file  Social Needs  . Financial resource strain: Not on file  . Food insecurity:    Worry: Not on file    Inability: Not on file  . Transportation needs:    Medical: Not on file    Non-medical: Not on file  Tobacco Use  . Smoking status: Never Smoker  . Smokeless tobacco: Never Used  Substance and Sexual Activity  . Alcohol use: Yes  . Drug use: Not Currently  . Sexual activity: Not on file  Lifestyle  . Physical activity:    Days per week: Not on file    Minutes per session: Not on file  . Stress: Not on file  Relationships  . Social connections:    Talks on phone: Not on file    Gets together: Not on file    Attends religious service: Not on file    Active member of club or organization: Not on file    Attends meetings of clubs or organizations: Not on file    Relationship status: Not on file  Other Topics Concern  . Not on file  Social History Narrative  . Not on file     Current Outpatient Medications:  .  aspirin 325 MG EC tablet, Take 325 mg by mouth daily., Disp: , Rfl:  .  ibuprofen (ADVIL,MOTRIN) 200 MG tablet, Take 200 mg by mouth every 6 (six) hours as needed., Disp: , Rfl:   EXAM:  Vitals:   10/17/17 1608  BP: 120/70  Pulse: 82  Temp: 98.1 F (36.7 C)  SpO2: 97%    Body mass index is 18.31 kg/m.  GENERAL: vitals reviewed and listed above, alert,  oriented, appears well hydrated and in no acute distress  HEENT: atraumatic, conjunttiva clear, no obvious abnormalities on inspection of external nose and ears  NECK: no obvious masses on inspection, no carotid bruits  LUNGS: clear to auscultation bilaterally, no wheezes, rales or rhonchi, good air movement  CV: HRRR, no peripheral edema, no JVD, BP normal range, normal radial pulses  MS: moves all extremities without noticeable abnormality.  Decreased range of motion right hip.  ambulates with slight limp  PSYCH: pleasant and cooperative, no obvious depression or anxiety  ASSESSMENT AND PLAN:  Discussed the following assessment and plan:  Preop general physical exam  - Plan: CMP, CBC (no diff) -Discussed all completion paperwork for preop evaluation after lab results are back. -Patient advised he may need to return to clinic for further evaluation especially if lab results are abnormal.  Assessment: -Risk factors: h/o stroke -Surgery Risks:intermediate -age, nutritional status, thin, BMI slightly underweight 18.3, age >  64, no fraility -functional capacity: > 4 METs without symptoms -comorbidities: none Patient Specific Risks: patient is low risk for intermediate risks surgery   Recommendations for optimizing general medical care prior to surgery: -advised patient to discuss specific risks morbidity and mortality of surgery with surgeon, CV risks discussed with patient -no specific medical recommendations for this patient at this time and no recommendations to defer surgery or for further CV testing prior to surgery -form for pre-op optimization of general medical care prior to surgery to be faxed to surgeon office after lab results return. --Of note: concern about pt's EtOH intake of a 6 pack per day.  -Patient advised to return or notify a doctor immediately if symptoms worsen or persist or new concerns arise.  There are no Patient Instructions on file for this  visit.   [name]

## 2017-10-18 LAB — COMPREHENSIVE METABOLIC PANEL
AG RATIO: 1.6 (calc) (ref 1.0–2.5)
ALKALINE PHOSPHATASE (APISO): 61 U/L (ref 40–115)
ALT: 28 U/L (ref 9–46)
AST: 35 U/L (ref 10–35)
Albumin: 4.4 g/dL (ref 3.6–5.1)
BUN: 19 mg/dL (ref 7–25)
CO2: 26 mmol/L (ref 20–32)
Calcium: 9.5 mg/dL (ref 8.6–10.3)
Chloride: 102 mmol/L (ref 98–110)
Creat: 1.15 mg/dL (ref 0.70–1.33)
GLOBULIN: 2.8 g/dL (ref 1.9–3.7)
Glucose, Bld: 86 mg/dL (ref 65–99)
POTASSIUM: 4.2 mmol/L (ref 3.5–5.3)
Sodium: 137 mmol/L (ref 135–146)
Total Bilirubin: 0.6 mg/dL (ref 0.2–1.2)
Total Protein: 7.2 g/dL (ref 6.1–8.1)

## 2017-10-18 LAB — CBC
HCT: 43.6 % (ref 38.5–50.0)
Hemoglobin: 15.7 g/dL (ref 13.2–17.1)
MCH: 32.2 pg (ref 27.0–33.0)
MCHC: 36 g/dL (ref 32.0–36.0)
MCV: 89.5 fL (ref 80.0–100.0)
MPV: 9.5 fL (ref 7.5–12.5)
PLATELETS: 143 10*3/uL (ref 140–400)
RBC: 4.87 10*6/uL (ref 4.20–5.80)
RDW: 13 % (ref 11.0–15.0)
WBC: 4 10*3/uL (ref 3.8–10.8)

## 2017-10-27 ENCOUNTER — Other Ambulatory Visit (HOSPITAL_COMMUNITY): Payer: Self-pay

## 2017-10-30 ENCOUNTER — Telehealth: Payer: Self-pay | Admitting: Family Medicine

## 2017-10-30 NOTE — Telephone Encounter (Signed)
Copied from [place] [phone]. Topic: General - Other >> Oct 30, 2017 12:43 PM Lennox Solders wrote: Reason for CRM:pt is calling to see if dr caffey office has fax medical clearance form to office. Pt is need the form complete before surgery. Pt saw dr banks in April 2019. Pt will be having total hip replacement

## 2017-10-31 NOTE — Telephone Encounter (Signed)
Form has been competed by Dr Volanda Napoleon and has been faxed to [facility] from [employer] office, pt is aware.

## 2017-10-31 NOTE — Telephone Encounter (Signed)
Spoke with Elberta Fortis from Dr Nicholes Stairs office notified them that we have not received pt surgical clearance form, stated that the office should be refaxing another copy for completion.

## 2017-11-07 ENCOUNTER — Inpatient Hospital Stay: Admit: 2017-11-07 | Payer: Self-pay | Admitting: Orthopedic Surgery

## 2017-11-07 SURGERY — ARTHROPLASTY, HIP, TOTAL,POSTERIOR APPROACH
Anesthesia: Choice | Laterality: Right

## 2017-11-19 ENCOUNTER — Ambulatory Visit: Payer: Self-pay | Admitting: Physician Assistant

## 2017-11-19 NOTE — H&P (View-Only) (Signed)
TOTAL HIP ADMISSION H&P  Patient is admitted for right total hip arthroplasty.  Subjective:  Chief Complaint: right hip pain  HPI: [name], 52 y.o. male, has a history of pain and functional disability in the right hip(s) due to arthritis and patient has failed non-surgical conservative treatments for greater than 12 weeks to include NSAID's and/or analgesics and activity modification.  Onset of symptoms was gradual starting >10 years ago with gradually worsening course since that time.The patient noted no past surgery on the right hip(s).  Patient currently rates pain in the right hip at 10 out of 10 with activity. Patient has night pain, worsening of pain with activity and weight bearing, pain that interfers with activities of daily living and pain with passive range of motion. Patient has evidence of subchondral cysts, periarticular osteophytes and joint space narrowing by imaging studies. This condition presents safety issues increasing the risk of falls.  There is no current active infection.  There are no active problems to display for this patient.  Past Medical History:  Diagnosis Date  . High cholesterol   . Stroke [facility])     No past surgical history on file.  Current Outpatient Medications  Medication Sig Dispense Refill Last Dose  . aspirin 325 MG EC tablet Take 325 mg by mouth daily.   Taking   No current facility-administered medications for this visit.    No Known Allergies  Social History   Tobacco Use  . Smoking status: Never Smoker  . Smokeless tobacco: Never Used  Substance Use Topics  . Alcohol use: Yes    No family history on file.   Review of Systems  Musculoskeletal: Positive for joint pain.  All other systems reviewed and are negative.   Objective:  Physical Exam  Constitutional: He is oriented to person, place, and time. He appears well-developed and well-nourished. No distress.  HENT:  Head: Normocephalic and atraumatic.  Nose: Nose  normal.  Eyes: Pupils are equal, round, and reactive to light. Conjunctivae and EOM are normal.  Neck: Normal range of motion. Neck supple.  Cardiovascular: Normal rate, regular rhythm, normal heart sounds and intact distal pulses.  Respiratory: Effort normal and breath sounds normal. No respiratory distress. He has no wheezes.  GI: Soft. Bowel sounds are normal. He exhibits no distension. There is no tenderness.  Musculoskeletal:       Right hip: He exhibits decreased range of motion, tenderness and bony tenderness.  Lymphadenopathy:    He has no cervical adenopathy.  Neurological: He is alert and oriented to person, place, and time. No cranial nerve deficit.  Skin: Skin is warm and dry. No rash noted. No erythema.  Psychiatric: He has a normal mood and affect. His behavior is normal.    Vital signs in last 24 hours: @VSRANGES @  Labs:   Estimated body mass index is 18.31 kg/m as calculated from the following:   Height as of 10/17/17: 6\' 3"  (1.905 m).   Weight as of 10/17/17: 66.5 kg (146 lb 8 oz).   Imaging Review Plain radiographs demonstrate severe degenerative joint disease of the right hip(s). The bone quality appears to be good for age and reported activity level.    Preoperative templating of the joint replacement has been completed, documented, and submitted to the Operating Room personnel in order to optimize intra-operative equipment management.     Assessment/Plan:  End stage arthritis, right hip(s)  The patient history, physical examination, clinical judgement of the provider and imaging studies are  consistent with end stage degenerative joint disease of the right hip(s) and total hip arthroplasty is deemed medically necessary. The treatment options including medical management, injection therapy, arthroscopy and arthroplasty were discussed at length. The risks and benefits of total hip arthroplasty were presented and reviewed. The risks due to aseptic loosening,  infection, stiffness, dislocation/subluxation,  thromboembolic complications and other imponderables were discussed.  The patient acknowledged the explanation, agreed to proceed with the plan and consent was signed. Patient is being admitted for inpatient treatment for surgery, pain control, PT, OT, prophylactic antibiotics, VTE prophylaxis, progressive ambulation and ADL's and discharge planning.The patient is planning to be discharged home with home health services

## 2017-11-19 NOTE — H&P (Signed)
TOTAL HIP ADMISSION H&P  Patient is admitted for right total hip arthroplasty.  Subjective:  Chief Complaint: right hip pain  HPI: [name], 52 y.o. male, has a history of pain and functional disability in the right hip(s) due to arthritis and patient has failed non-surgical conservative treatments for greater than 12 weeks to include NSAID's and/or analgesics and activity modification.  Onset of symptoms was gradual starting >10 years ago with gradually worsening course since that time.The patient noted no past surgery on the right hip(s).  Patient currently rates pain in the right hip at 10 out of 10 with activity. Patient has night pain, worsening of pain with activity and weight bearing, pain that interfers with activities of daily living and pain with passive range of motion. Patient has evidence of subchondral cysts, periarticular osteophytes and joint space narrowing by imaging studies. This condition presents safety issues increasing the risk of falls.  There is no current active infection.  There are no active problems to display for this patient.  Past Medical History:  Diagnosis Date  . High cholesterol   . Stroke [facility])     No past surgical history on file.  Current Outpatient Medications  Medication Sig Dispense Refill Last Dose  . aspirin 325 MG EC tablet Take 325 mg by mouth daily.   Taking   No current facility-administered medications for this visit.    No Known Allergies  Social History   Tobacco Use  . Smoking status: Never Smoker  . Smokeless tobacco: Never Used  Substance Use Topics  . Alcohol use: Yes    No family history on file.   Review of Systems  Musculoskeletal: Positive for joint pain.  All other systems reviewed and are negative.   Objective:  Physical Exam  Constitutional: He is oriented to person, place, and time. He appears well-developed and well-nourished. No distress.  HENT:  Head: Normocephalic and atraumatic.  Nose: Nose  normal.  Eyes: Pupils are equal, round, and reactive to light. Conjunctivae and EOM are normal.  Neck: Normal range of motion. Neck supple.  Cardiovascular: Normal rate, regular rhythm, normal heart sounds and intact distal pulses.  Respiratory: Effort normal and breath sounds normal. No respiratory distress. He has no wheezes.  GI: Soft. Bowel sounds are normal. He exhibits no distension. There is no tenderness.  Musculoskeletal:       Right hip: He exhibits decreased range of motion, tenderness and bony tenderness.  Lymphadenopathy:    He has no cervical adenopathy.  Neurological: He is alert and oriented to person, place, and time. No cranial nerve deficit.  Skin: Skin is warm and dry. No rash noted. No erythema.  Psychiatric: He has a normal mood and affect. His behavior is normal.    Vital signs in last 24 hours: @VSRANGES @  Labs:   Estimated body mass index is 18.31 kg/m as calculated from the following:   Height as of 10/17/17: 6\' 3"  (1.905 m).   Weight as of 10/17/17: 66.5 kg (146 lb 8 oz).   Imaging Review Plain radiographs demonstrate severe degenerative joint disease of the right hip(s). The bone quality appears to be good for age and reported activity level.    Preoperative templating of the joint replacement has been completed, documented, and submitted to the Operating Room personnel in order to optimize intra-operative equipment management.     Assessment/Plan:  End stage arthritis, right hip(s)  The patient history, physical examination, clinical judgement of the provider and imaging studies are  consistent with end stage degenerative joint disease of the right hip(s) and total hip arthroplasty is deemed medically necessary. The treatment options including medical management, injection therapy, arthroscopy and arthroplasty were discussed at length. The risks and benefits of total hip arthroplasty were presented and reviewed. The risks due to aseptic loosening,  infection, stiffness, dislocation/subluxation,  thromboembolic complications and other imponderables were discussed.  The patient acknowledged the explanation, agreed to proceed with the plan and consent was signed. Patient is being admitted for inpatient treatment for surgery, pain control, PT, OT, prophylactic antibiotics, VTE prophylaxis, progressive ambulation and ADL's and discharge planning.The patient is planning to be discharged home with home health services

## 2017-11-21 NOTE — Pre-Procedure Instructions (Signed)
[name]  11/21/2017      [id number] - [place], [address] [address] Phone: [phone] Fax: [phone]    Your procedure is scheduled on Friday, June 14th   Report to [facility] Admitting at 5:30 AM             (posted surgery time 7:30a - 9:48a)   Call this number if you have problems the morning of surgery:  [phone]   Remember:              4-5 days prior to surgery, STOP TAKING any Vitamins, Herbal Supplements, Anti-inflammatories, Blood thinners (aspirin)   Nothing to eat or drink after 12 midnight Thursday.   Take these medicines the morning of surgery with A SIP OF WATER : Nothing    Do not wear jewelry - no rings or watches.  Do not wear lotions, colognes or deodorant.   Men may shave face and neck.  Do not bring valuables to the hospital.  [facility] is not responsible for any belongings or valuables.  Contacts, dentures or bridgework may not be worn into surgery.  Leave your suitcase in the car.  After surgery it may be brought to your room.  For patients admitted to the hospital, discharge time will be determined by your treatment team.  Please read over the following fact sheets that you were given. Pain Booklet, MRSA Information and Surgical Site Infection Prevention       [place]- Preparing For Surgery  Before surgery, you can play an important role. Because skin is not sterile, your skin needs to be as free of germs as possible. You can reduce the number of germs on your skin by washing with CHG (chlorahexidine gluconate) Soap before surgery.  CHG is an antiseptic cleaner which kills germs and bonds with the skin to continue killing germs even after washing.    Oral Hygiene is also important to reduce your risk of infection.  Remember - BRUSH YOUR TEETH THE MORNING OF SURGERY WITH YOUR REGULAR TOOTHPASTE  Please do not use if you have an allergy to CHG or antibacterial  soaps. If your skin becomes reddened/irritated stop using the CHG.  Do not shave (including legs and underarms) for at least 48 hours prior to first CHG shower. It is OK to shave your face.  Please follow these instructions carefully.   1. Shower the NIGHT BEFORE SURGERY and the MORNING OF SURGERY with CHG.   2. If you chose to wash your hair, wash your hair first as usual with your normal shampoo.  3. After you shampoo, rinse your hair and body thoroughly to remove the shampoo.  4. Use CHG as you would any other liquid soap. You can apply CHG directly to the skin and wash gently with a scrungie or a clean washcloth.   5. Apply the CHG Soap to your body ONLY FROM THE NECK DOWN.  Do not use on open wounds or open sores. Avoid contact with your eyes, ears, mouth and genitals (private parts). Wash Face and genitals (private parts)  with your normal soap.  6. Wash thoroughly, paying special attention to the area where your surgery will be performed.  7. Thoroughly rinse your body with warm water from the neck down.  8. DO NOT shower/wash with your normal soap after using and rinsing off the CHG Soap.  9. Pat yourself dry with a CLEAN TOWEL.  10.  Wear CLEAN PAJAMAS to bed the night before surgery, wear comfortable clothes the morning of surgery  11. Place CLEAN SHEETS on your bed the night of your first shower and DO NOT SLEEP WITH PETS.    Day of Surgery:  Do not apply any deodorants/lotions.  Please wear clean clothes to the hospital/surgery center.   Remember to brush your teeth WITH YOUR REGULAR TOOTHPASTE.

## 2017-11-24 ENCOUNTER — Inpatient Hospital Stay (HOSPITAL_COMMUNITY)
Admission: RE | Admit: 2017-11-24 | Discharge: 2017-11-24 | Disposition: A | Discharge status: Home / Self Care | Payer: Managed Care, Other (non HMO) | Source: Ambulatory Visit

## 2017-11-24 NOTE — Progress Notes (Signed)
Breyton Vanscyoc Kolenovic            11/21/2017                          [id number] - [place], [address] [place] [address] Phone: [phone] Fax: [phone]              Your procedure is scheduled on Friday, June 14th             Report to [facility] Admitting at 5:30 AM              Call this number if you have problems the morning of surgery:            [phone]             Remember:  Nothing to eat or drink after 12 midnight Thursday  Follow your surgeon's instructions on when to hold/resume aspirin  7 days prior to surgery STOP taking any  Aleve, Naproxen, Ibuprofen, Motrin, Advil, Goody's, BC's, all herbal medications, fish oil, and all vitamins             Take these medicines the morning of surgery with A SIP OF WATER : Nothing                        Do not wear jewelry - no rings or watches.            Do not wear lotions, colognes or deodorant.             Men may shave face and neck.            Do not bring valuables to the hospital.            [facility] is not responsible for any belongings or valuables.  Contacts, dentures or bridgework may not be worn into surgery.  Leave your suitcase in the car.  After surgery it may be brought to your room.  For patients admitted to the hospital, discharge time will be determined by your treatment team.  Please read over the following fact sheets that you were given. Pain Booklet, MRSA Information and Surgical Site Infection Prevention                           [place]- Preparing For Surgery  Before surgery, you can play an important role. Because skin is not sterile, your skin needs to be as free of germs as possible. You can reduce the number of germs on your skin by washing with CHG (chlorahexidine gluconate) Soap before surgery.  CHG is an antiseptic cleaner which kills germs and bonds with the skin to continue killing germs even after washing.     Oral Hygiene is also important to reduce your risk of infection.  Remember - BRUSH YOUR TEETH THE MORNING OF SURGERY WITH YOUR REGULAR TOOTHPASTE  Please do not use if you have an allergy to CHG or antibacterial soaps. If your skin becomes reddened/irritated stop using the CHG.  Do not shave (including legs and underarms) for at least 48 hours prior to first CHG shower. It is OK to shave your face.  Please follow these instructions carefully.  1. Shower the NIGHT BEFORE SURGERY and the MORNING OF SURGERY with CHG.   2. If you chose to wash your hair, wash your hair first as usual with your normal shampoo.  3. After you shampoo, rinse your hair and body thoroughly to remove the shampoo.  4. Use CHG as you would any other liquid soap. You can apply CHG directly to the skin and wash gently with a scrungie or a clean washcloth.   5. Apply the CHG Soap to your body ONLY FROM THE NECK DOWN.  Do not use on open wounds or open sores. Avoid contact with your eyes, ears, mouth and genitals (private parts). Wash Face and genitals (private parts)  with your normal soap.  6. Wash thoroughly, paying special attention to the area where your surgery will be performed.  7. Thoroughly rinse your body with warm water from the neck down.  8. DO NOT shower/wash with your normal soap after using and rinsing off the CHG Soap.  9. Pat yourself dry with a CLEAN TOWEL.  10. Wear CLEAN PAJAMAS to bed the night before surgery, wear comfortable clothes the morning of surgery  11. Place CLEAN SHEETS on your bed the night of your first shower and DO NOT SLEEP WITH PETS.    Day of Surgery:  Do not apply any deodorants/lotions.  Please wear clean clothes to the hospital/surgery center.   Remember to brush your teeth WITH YOUR REGULAR TOOTHPASTE.

## 2017-12-02 NOTE — Pre-Procedure Instructions (Signed)
Sean Malinowski Brashears  12/02/2017      [id number] - [place], [place] - [address] [place] [address] Phone: [phone] Fax: [phone]    Your procedure is scheduled on Fri., December 05, 2017 from 7:30AM-9:48AM  Report to [facility] Admitting Entrance "A" at 5:30AM  Call this number if you have problems the morning of surgery:  [phone]   Remember:  No food or drinks after midnight on June 13th    Take these medicines the morning of surgery with A SIP OF WATER: NONE  Follow your doctors instructions regarding your Aspirin.  If no instructions were given by your doctor, then you will need to call the prescribing office office to get instructions.    7 days before surgery stop taking all Other Aspirin Products, Vitamins, Fish oils, and Herbal medications. Also stop all NSAIDS i.e. Advil, Ibuprofen, Motrin, Aleve, Anaprox, Naproxen, BC, Goody Powders, and all Supplements.    Do not wear jewelry.  Do not wear lotions, powders, colognes, or deodorant.  Do not shave 48 hours prior to surgery.  Men may shave face.  Do not bring valuables to the hospital.  [facility] is not responsible for any belongings or valuables.  Contacts, dentures or bridgework may not be worn into surgery.  Leave your suitcase in the car.  After surgery it may be brought to your room.  For patients admitted to the hospital, discharge time will be determined by your treatment team.  Patients discharged the day of surgery will not be allowed to drive home.   Special instructions:   [place]- Preparing For Surgery  Before surgery, you can play an important role. Because skin is not sterile, your skin needs to be as free of germs as possible. You can reduce the number of germs on your skin by washing with CHG (chlorahexidine gluconate) Soap before surgery.  CHG is an antiseptic cleaner which kills germs and bonds with the skin to continue killing germs even after  washing.    Oral Hygiene is also important to reduce your risk of infection.  Remember - BRUSH YOUR TEETH THE MORNING OF SURGERY WITH YOUR REGULAR TOOTHPASTE  Please do not use if you have an allergy to CHG or antibacterial soaps. If your skin becomes reddened/irritated stop using the CHG.  Do not shave (including legs and underarms) for at least 48 hours prior to first CHG shower. It is OK to shave your face.  Please follow these instructions carefully.   1. Shower the NIGHT BEFORE SURGERY and the MORNING OF SURGERY with CHG.   2. If you chose to wash your hair, wash your hair first as usual with your normal shampoo.  3. After you shampoo, rinse your hair and body thoroughly to remove the shampoo.  4. Use CHG as you would any other liquid soap. You can apply CHG directly to the skin and wash gently with a scrungie or a clean washcloth.   5. Apply the CHG Soap to your body ONLY FROM THE NECK DOWN.  Do not use on open wounds or open sores. Avoid contact with your eyes, ears, mouth and genitals (private parts). Wash Face and genitals (private parts)  with your normal soap.  6. Wash thoroughly, paying special attention to the area where your surgery will be performed.  7. Thoroughly rinse your body with warm water from the neck down.  8. DO NOT shower/wash with your normal soap after using and  rinsing off the CHG Soap.  9. Pat yourself dry with a CLEAN TOWEL.  10. Wear CLEAN PAJAMAS to bed the night before surgery, wear comfortable clothes the morning of surgery  11. Place CLEAN SHEETS on your bed the night of your first shower and DO NOT SLEEP WITH PETS.  Day of Surgery:  Do not apply any deodorants/lotions.  Please wear clean clothes to the hospital/surgery center.   Remember to brush your teeth WITH YOUR REGULAR TOOTHPASTE.  Please read over the following fact sheets that you were given. Pain Booklet, Coughing and Deep Breathing, MRSA Information and Surgical Site Infection  Prevention

## 2017-12-03 ENCOUNTER — Inpatient Hospital Stay (HOSPITAL_COMMUNITY)
Admission: RE | Admit: 2017-12-03 | Discharge: 2017-12-03 | Disposition: A | Discharge status: Home / Self Care | Payer: Managed Care, Other (non HMO) | Source: Ambulatory Visit

## 2017-12-03 NOTE — Progress Notes (Signed)
Pt sts he was confused to the time of his appointment. He sts he thought his appointment was at 8:45am. Pt sts he will not be able to make his appointment. Pt sts he will call back to get surgical instructions. Surgery scheduler made aware.

## 2017-12-04 ENCOUNTER — Encounter (HOSPITAL_COMMUNITY)
Admission: RE | Admit: 2017-12-04 | Discharge: 2017-12-04 | Disposition: A | Discharge status: Home / Self Care | Payer: Managed Care, Other (non HMO) | Source: Ambulatory Visit | Attending: Orthopedic Surgery | Admitting: Orthopedic Surgery

## 2017-12-04 ENCOUNTER — Encounter (HOSPITAL_COMMUNITY): Payer: Self-pay

## 2017-12-04 ENCOUNTER — Encounter (HOSPITAL_COMMUNITY)
Admission: RE | Admit: 2017-12-04 | Discharge: 2017-12-04 | Disposition: A | Discharge status: Home / Self Care | Payer: Managed Care, Other (non HMO) | Source: Ambulatory Visit | Attending: Physician Assistant | Admitting: Physician Assistant

## 2017-12-04 DIAGNOSIS — M1611 Unilateral primary osteoarthritis, right hip: Secondary | ICD-10-CM

## 2017-12-04 LAB — CBC WITH DIFFERENTIAL/PLATELET
Abs Immature Granulocytes: 0 10*3/uL (ref 0.0–0.1)
BASOS PCT: 1 %
Basophils Absolute: 0 10*3/uL (ref 0.0–0.1)
EOS PCT: 4 %
Eosinophils Absolute: 0.2 10*3/uL (ref 0.0–0.7)
HEMATOCRIT: 48.3 % (ref 39.0–52.0)
Hemoglobin: 16.3 g/dL (ref 13.0–17.0)
Immature Granulocytes: 0 %
LYMPHS ABS: 1.3 10*3/uL (ref 0.7–4.0)
Lymphocytes Relative: 34 %
MCH: 31.3 pg (ref 26.0–34.0)
MCHC: 33.7 g/dL (ref 30.0–36.0)
MCV: 92.9 fL (ref 78.0–100.0)
Monocytes Absolute: 0.3 10*3/uL (ref 0.1–1.0)
Monocytes Relative: 6 %
Neutro Abs: 2.1 10*3/uL (ref 1.7–7.7)
Neutrophils Relative %: 55 %
PLATELETS: 142 10*3/uL — AB (ref 150–400)
RBC: 5.2 MIL/uL (ref 4.22–5.81)
RDW: 13 % (ref 11.5–15.5)
WBC: 3.9 10*3/uL — AB (ref 4.0–10.5)

## 2017-12-04 LAB — URINALYSIS, ROUTINE W REFLEX MICROSCOPIC
Bilirubin Urine: NEGATIVE
Glucose, UA: NEGATIVE mg/dL
HGB URINE DIPSTICK: NEGATIVE
KETONES UR: NEGATIVE mg/dL
Leukocytes, UA: NEGATIVE
Nitrite: NEGATIVE
PH: 5 (ref 5.0–8.0)
PROTEIN: NEGATIVE mg/dL
Specific Gravity, Urine: 1.015 (ref 1.005–1.030)

## 2017-12-04 LAB — SURGICAL PCR SCREEN
MRSA, PCR: NEGATIVE
STAPHYLOCOCCUS AUREUS: NEGATIVE

## 2017-12-04 LAB — PROTIME-INR
INR: 0.99
PROTHROMBIN TIME: 13 s (ref 11.4–15.2)

## 2017-12-04 LAB — COMPREHENSIVE METABOLIC PANEL
ALBUMIN: 3.9 g/dL (ref 3.5–5.0)
ALT: 32 U/L (ref 17–63)
AST: 28 U/L (ref 15–41)
Alkaline Phosphatase: 49 U/L (ref 38–126)
Anion gap: 8 (ref 5–15)
BILIRUBIN TOTAL: 1 mg/dL (ref 0.3–1.2)
BUN: 20 mg/dL (ref 6–20)
CO2: 29 mmol/L (ref 22–32)
Calcium: 9.2 mg/dL (ref 8.9–10.3)
Chloride: 102 mmol/L (ref 101–111)
Creatinine, Ser: 1.03 mg/dL (ref 0.61–1.24)
GFR calc Af Amer: >60 mL/min (ref >60)
Glucose, Bld: 94 mg/dL (ref 65–99)
POTASSIUM: 4.6 mmol/L (ref 3.5–5.1)
Sodium: 139 mmol/L (ref 135–145)
TOTAL PROTEIN: 7.1 g/dL (ref 6.5–8.1)

## 2017-12-04 LAB — APTT: APTT: 28 s (ref 24–36)

## 2017-12-04 MED ORDER — TRANEXAMIC ACID 1000 MG/10ML IV SOLN
1000.0000 mg | INTRAVENOUS | Status: AC
  Administered 2017-12-05: 1000 mg via INTRAVENOUS
  Filled 2017-12-04: qty 1100

## 2017-12-04 MED ORDER — TRANEXAMIC ACID 1000 MG/10ML IV SOLN
2000.0000 mg | INTRAVENOUS | Status: DC
  Filled 2017-12-04: qty 20

## 2017-12-04 NOTE — Anesthesia Preprocedure Evaluation (Addendum)
Anesthesia Evaluation  Patient identified by MRN, date of birth, ID band Patient awake    Reviewed: Allergy & Precautions, H&P , NPO status , Patient's Chart, lab work & pertinent test results, reviewed documented beta blocker date and time   Airway Mallampati: II  TM Distance: >3 FB Neck ROM: full    Dental no notable dental hx. (+) Dental Advisory Given, Teeth Intact   Pulmonary neg pulmonary ROS,    Pulmonary exam normal breath sounds clear to auscultation       Cardiovascular Exercise Tolerance: Good  Rhythm:regular Rate:Normal     Neuro/Psych CVA, No Residual Symptoms    GI/Hepatic negative GI ROS, Neg liver ROS,   Endo/Other  negative endocrine ROS  Renal/GU negative Renal ROS  negative genitourinary   Musculoskeletal   Abdominal   Peds negative pediatric ROS (+)  Hematology negative hematology ROS (+)   Anesthesia Other Findings   Reproductive/Obstetrics negative OB ROS                            Anesthesia Physical Anesthesia Plan  ASA: II  Anesthesia Plan: Spinal   Post-op Pain Management:    Induction:   PONV Risk Score and Plan: 2 and Ondansetron, Dexamethasone and Treatment may vary due to age or medical condition  Airway Management Planned: Nasal Cannula and Natural Airway  Additional Equipment:   Intra-op Plan:   Post-operative Plan:   Informed Consent: I have reviewed the patients History and Physical, chart, labs and discussed the procedure including the risks, benefits and alternatives for the proposed anesthesia with the patient or authorized representative who has indicated his/her understanding and acceptance.   Dental Advisory Given  Plan Discussed with: CRNA, Anesthesiologist and Surgeon  Anesthesia Plan Comments: (  )        Anesthesia Quick Evaluation

## 2017-12-05 ENCOUNTER — Inpatient Hospital Stay (HOSPITAL_COMMUNITY): Payer: Managed Care, Other (non HMO)

## 2017-12-05 ENCOUNTER — Inpatient Hospital Stay (HOSPITAL_COMMUNITY): Payer: Managed Care, Other (non HMO) | Admitting: Anesthesiology

## 2017-12-05 ENCOUNTER — Inpatient Hospital Stay (HOSPITAL_COMMUNITY)
Admission: RE | Admit: 2017-12-05 | Discharge: 2017-12-06 | DRG: 470 | Disposition: A | Discharge status: Home Health | Payer: Managed Care, Other (non HMO) | Source: Ambulatory Visit | Attending: Orthopedic Surgery | Admitting: Orthopedic Surgery

## 2017-12-05 ENCOUNTER — Encounter (HOSPITAL_COMMUNITY)
Admission: RE | Disposition: A | Discharge status: Home Health | Payer: Self-pay | Source: Ambulatory Visit | Attending: Orthopedic Surgery

## 2017-12-05 ENCOUNTER — Encounter (HOSPITAL_COMMUNITY): Payer: Self-pay | Admitting: *Deleted

## 2017-12-05 DIAGNOSIS — Z7982 Long term (current) use of aspirin: Secondary | ICD-10-CM

## 2017-12-05 DIAGNOSIS — Z96641 Presence of right artificial hip joint: Secondary | ICD-10-CM | POA: Insufficient documentation

## 2017-12-05 DIAGNOSIS — Z8673 Personal history of transient ischemic attack (TIA), and cerebral infarction without residual deficits: Secondary | ICD-10-CM

## 2017-12-05 DIAGNOSIS — Z9181 History of falling: Secondary | ICD-10-CM | POA: Diagnosis not present

## 2017-12-05 DIAGNOSIS — M1611 Unilateral primary osteoarthritis, right hip: Secondary | ICD-10-CM | POA: Diagnosis present

## 2017-12-05 HISTORY — PX: TOTAL HIP ARTHROPLASTY: SHX124

## 2017-12-05 LAB — URINE CULTURE: Culture: NO GROWTH

## 2017-12-05 LAB — TYPE AND SCREEN
ABO/RH(D): AB POS
Antibody Screen: POSITIVE

## 2017-12-05 SURGERY — ARTHROPLASTY, HIP, TOTAL,POSTERIOR APPROACH
Anesthesia: Spinal | Laterality: Right

## 2017-12-05 MED ORDER — ACETAMINOPHEN 160 MG/5ML PO SOLN: 325.0000 mg | ORAL | Status: DC | PRN

## 2017-12-05 MED ORDER — POLYETHYLENE GLYCOL 3350 17 G PO PACK: PACK | ORAL | 0 refills | Status: DC

## 2017-12-05 MED ORDER — SODIUM CHLORIDE 0.9 % IR SOLN
Status: DC | PRN
  Administered 2017-12-05: 3000 mL

## 2017-12-05 MED ORDER — MENTHOL 3 MG MT LOZG: 1.0000 | LOZENGE | OROMUCOSAL | Status: DC | PRN

## 2017-12-05 MED ORDER — SODIUM CHLORIDE 0.9 % IV SOLN
INTRAVENOUS | Status: DC
  Administered 2017-12-05: 17:00:00 via INTRAVENOUS

## 2017-12-05 MED ORDER — GABAPENTIN 300 MG PO CAPS
300.0000 mg | ORAL_CAPSULE | Freq: Three times a day (TID) | ORAL | Status: DC
  Administered 2017-12-05 – 2017-12-06 (×3): 300 mg via ORAL
  Filled 2017-12-05 (×3): qty 1

## 2017-12-05 MED ORDER — POLYETHYLENE GLYCOL 3350 17 G PO PACK
17.0000 g | PACK | Freq: Two times a day (BID) | ORAL | Status: DC
  Administered 2017-12-05 – 2017-12-06 (×2): 17 g via ORAL
  Filled 2017-12-05 (×2): qty 1

## 2017-12-05 MED ORDER — PROPOFOL 500 MG/50ML IV EMUL
INTRAVENOUS | Status: DC | PRN
  Administered 2017-12-05: 75 ug/kg/min via INTRAVENOUS

## 2017-12-05 MED ORDER — ACETAMINOPHEN 500 MG PO TABS
1000.0000 mg | ORAL_TABLET | Freq: Four times a day (QID) | ORAL | Status: DC
  Administered 2017-12-05 – 2017-12-06 (×2): 1000 mg via ORAL
  Filled 2017-12-05 (×2): qty 2

## 2017-12-05 MED ORDER — MEPERIDINE HCL 50 MG/ML IJ SOLN: 6.2500 mg | INTRAMUSCULAR | Status: DC | PRN

## 2017-12-05 MED ORDER — ASPIRIN EC 325 MG PO TBEC
325.0000 mg | DELAYED_RELEASE_TABLET | Freq: Every day | ORAL | Status: DC
  Administered 2017-12-06: 325 mg via ORAL
  Filled 2017-12-05: qty 1

## 2017-12-05 MED ORDER — HYDROMORPHONE HCL 2 MG/ML IJ SOLN
0.5000 mg | INTRAMUSCULAR | Status: DC | PRN
  Administered 2017-12-05 – 2017-12-06 (×4): 1 mg via INTRAVENOUS
  Filled 2017-12-05 (×4): qty 1

## 2017-12-05 MED ORDER — PHENYLEPHRINE HCL 10 MG/ML IJ SOLN
INTRAMUSCULAR | Status: DC | PRN
  Administered 2017-12-05 (×2): 80 ug via INTRAVENOUS
  Administered 2017-12-05: 40 ug via INTRAVENOUS
  Administered 2017-12-05: 80 ug via INTRAVENOUS
  Administered 2017-12-05: 120 ug via INTRAVENOUS

## 2017-12-05 MED ORDER — DEXAMETHASONE SODIUM PHOSPHATE 10 MG/ML IJ SOLN
INTRAMUSCULAR | Status: DC | PRN
  Administered 2017-12-05: 10 mg via INTRAVENOUS

## 2017-12-05 MED ORDER — FENTANYL CITRATE (PF) 100 MCG/2ML IJ SOLN
25.0000 ug | INTRAMUSCULAR | Status: DC | PRN
  Administered 2017-12-05: 50 ug via INTRAVENOUS

## 2017-12-05 MED ORDER — OXYCODONE HCL 5 MG PO TABS: ORAL_TABLET | ORAL | 0 refills | Status: DC

## 2017-12-05 MED ORDER — ACETAMINOPHEN 325 MG PO TABS: 325.0000 mg | ORAL_TABLET | Freq: Four times a day (QID) | ORAL | Status: DC | PRN

## 2017-12-05 MED ORDER — FENTANYL CITRATE (PF) 100 MCG/2ML IJ SOLN
INTRAMUSCULAR | Status: DC | PRN
  Administered 2017-12-05: 50 ug via INTRAVENOUS

## 2017-12-05 MED ORDER — BUPIVACAINE-EPINEPHRINE (PF) 0.25% -1:200000 IJ SOLN
INTRAMUSCULAR | Status: DC | PRN
  Administered 2017-12-05: 50 mL

## 2017-12-05 MED ORDER — METOCLOPRAMIDE HCL 5 MG PO TABS: 5.0000 mg | ORAL_TABLET | Freq: Three times a day (TID) | ORAL | Status: DC | PRN

## 2017-12-05 MED ORDER — DOCUSATE SODIUM 100 MG PO CAPS
100.0000 mg | ORAL_CAPSULE | Freq: Two times a day (BID) | ORAL | Status: DC
  Administered 2017-12-05 – 2017-12-06 (×2): 100 mg via ORAL
  Filled 2017-12-05 (×2): qty 1

## 2017-12-05 MED ORDER — CEFAZOLIN SODIUM-DEXTROSE 2-4 GM/100ML-% IV SOLN
2.0000 g | INTRAVENOUS | Status: AC
  Administered 2017-12-05: 2 g via INTRAVENOUS

## 2017-12-05 MED ORDER — BUPIVACAINE LIPOSOME 1.3 % IJ SUSP
20.0000 mL | INTRAMUSCULAR | Status: AC
  Administered 2017-12-05: 20 mL
  Filled 2017-12-05: qty 20

## 2017-12-05 MED ORDER — DEXAMETHASONE SODIUM PHOSPHATE 10 MG/ML IJ SOLN
INTRAMUSCULAR | Status: AC
Start: 2017-12-05 — End: ?
  Filled 2017-12-05: qty 1

## 2017-12-05 MED ORDER — BUPIVACAINE-EPINEPHRINE (PF) 0.5% -1:200000 IJ SOLN
INTRAMUSCULAR | Status: AC
  Filled 2017-12-05: qty 30

## 2017-12-05 MED ORDER — CHLORHEXIDINE GLUCONATE 4 % EX LIQD: 60.0000 mL | Freq: Once | CUTANEOUS | Status: DC

## 2017-12-05 MED ORDER — CEFAZOLIN SODIUM-DEXTROSE 2-4 GM/100ML-% IV SOLN
INTRAVENOUS | Status: AC
  Filled 2017-12-05: qty 100

## 2017-12-05 MED ORDER — CEFAZOLIN SODIUM-DEXTROSE 2-4 GM/100ML-% IV SOLN
2.0000 g | Freq: Four times a day (QID) | INTRAVENOUS | Status: AC
  Administered 2017-12-05 (×2): 2 g via INTRAVENOUS
  Filled 2017-12-05 (×2): qty 100

## 2017-12-05 MED ORDER — ONDANSETRON HCL 4 MG/2ML IJ SOLN
INTRAMUSCULAR | Status: AC
  Filled 2017-12-05: qty 2

## 2017-12-05 MED ORDER — FENTANYL CITRATE (PF) 250 MCG/5ML IJ SOLN
INTRAMUSCULAR | Status: AC
  Filled 2017-12-05: qty 5

## 2017-12-05 MED ORDER — FENTANYL CITRATE (PF) 100 MCG/2ML IJ SOLN
INTRAMUSCULAR | Status: AC
  Filled 2017-12-05: qty 2

## 2017-12-05 MED ORDER — ONDANSETRON HCL 4 MG/2ML IJ SOLN: 4.0000 mg | Freq: Four times a day (QID) | INTRAMUSCULAR | Status: DC | PRN

## 2017-12-05 MED ORDER — PHENYLEPHRINE 40 MCG/ML (10ML) SYRINGE FOR IV PUSH (FOR BLOOD PRESSURE SUPPORT)
PREFILLED_SYRINGE | INTRAVENOUS | Status: AC
  Filled 2017-12-05: qty 10

## 2017-12-05 MED ORDER — OXYCODONE HCL 5 MG/5ML PO SOLN: 5.0000 mg | Freq: Once | ORAL | Status: AC | PRN

## 2017-12-05 MED ORDER — ONDANSETRON HCL 4 MG/2ML IJ SOLN
INTRAMUSCULAR | Status: DC | PRN
  Administered 2017-12-05: 4 mg via INTRAVENOUS

## 2017-12-05 MED ORDER — PROPOFOL 10 MG/ML IV BOLUS
INTRAVENOUS | Status: DC | PRN
  Administered 2017-12-05: 20 mg via INTRAVENOUS

## 2017-12-05 MED ORDER — OXYCODONE HCL 5 MG PO TABS
ORAL_TABLET | ORAL | Status: AC
  Administered 2017-12-05: 10 mg via ORAL
  Filled 2017-12-05: qty 1

## 2017-12-05 MED ORDER — BUPIVACAINE-EPINEPHRINE 0.25% -1:200000 IJ SOLN
INTRAMUSCULAR | Status: AC
  Filled 2017-12-05: qty 1

## 2017-12-05 MED ORDER — METOCLOPRAMIDE HCL 5 MG/ML IJ SOLN: 5.0000 mg | Freq: Three times a day (TID) | INTRAMUSCULAR | Status: DC | PRN

## 2017-12-05 MED ORDER — SODIUM CHLORIDE 0.9 % IJ SOLN
INTRAMUSCULAR | Status: DC | PRN
  Administered 2017-12-05: 30 mL

## 2017-12-05 MED ORDER — PROPOFOL 1000 MG/100ML IV EMUL
INTRAVENOUS | Status: AC
  Filled 2017-12-05: qty 100

## 2017-12-05 MED ORDER — LIDOCAINE 2% (20 MG/ML) 5 ML SYRINGE
INTRAMUSCULAR | Status: AC
  Filled 2017-12-05: qty 5

## 2017-12-05 MED ORDER — ACETAMINOPHEN 325 MG PO TABS: 325.0000 mg | ORAL_TABLET | ORAL | Status: DC | PRN

## 2017-12-05 MED ORDER — DEXTROSE 5 % IV SOLN
INTRAVENOUS | Status: DC | PRN
  Administered 2017-12-05: 20 ug/min via INTRAVENOUS

## 2017-12-05 MED ORDER — ONDANSETRON HCL 4 MG PO TABS: 4.0000 mg | ORAL_TABLET | Freq: Four times a day (QID) | ORAL | Status: DC | PRN

## 2017-12-05 MED ORDER — MIDAZOLAM HCL 2 MG/2ML IJ SOLN
INTRAMUSCULAR | Status: AC
  Filled 2017-12-05: qty 2

## 2017-12-05 MED ORDER — GABAPENTIN 300 MG PO CAPS: ORAL_CAPSULE | ORAL | 0 refills | Status: DC

## 2017-12-05 MED ORDER — ONDANSETRON HCL 4 MG/2ML IJ SOLN: 4.0000 mg | Freq: Once | INTRAMUSCULAR | Status: DC | PRN

## 2017-12-05 MED ORDER — OXYCODONE HCL 5 MG PO TABS
5.0000 mg | ORAL_TABLET | Freq: Once | ORAL | Status: AC | PRN
  Administered 2017-12-05: 5 mg via ORAL

## 2017-12-05 MED ORDER — ASPIRIN EC 325 MG PO TBEC: DELAYED_RELEASE_TABLET | ORAL | 0 refills | Status: DC

## 2017-12-05 MED ORDER — DOCUSATE SODIUM 100 MG PO CAPS: ORAL_CAPSULE | ORAL | 0 refills | Status: DC

## 2017-12-05 MED ORDER — MIDAZOLAM HCL 5 MG/5ML IJ SOLN
INTRAMUSCULAR | Status: DC | PRN
  Administered 2017-12-05: 2 mg via INTRAVENOUS

## 2017-12-05 MED ORDER — OXYCODONE HCL 5 MG PO TABS
5.0000 mg | ORAL_TABLET | ORAL | Status: DC | PRN
  Administered 2017-12-05 – 2017-12-06 (×6): 10 mg via ORAL
  Filled 2017-12-05 (×6): qty 2

## 2017-12-05 MED ORDER — ALBUMIN HUMAN 5 % IV SOLN
INTRAVENOUS | Status: DC | PRN
  Administered 2017-12-05: 09:00:00 via INTRAVENOUS

## 2017-12-05 MED ORDER — PHENOL 1.4 % MT LIQD: 1.0000 | OROMUCOSAL | Status: DC | PRN

## 2017-12-05 MED ORDER — LACTATED RINGERS IV SOLN
INTRAVENOUS | Status: DC | PRN
  Administered 2017-12-05 (×3): via INTRAVENOUS

## 2017-12-05 MED ORDER — PROPOFOL 10 MG/ML IV BOLUS
INTRAVENOUS | Status: AC
  Filled 2017-12-05: qty 20

## 2017-12-05 SURGICAL SUPPLY — 62 items
BENZOIN TINCTURE PRP APPL 2/3 (GAUZE/BANDAGES/DRESSINGS) ×3 IMPLANT
BLADE SAW SAG 73X25 THK (BLADE) ×1
BLADE SAW SGTL 73X25 THK (BLADE) ×2 IMPLANT
BRUSH FEMORAL CANAL (MISCELLANEOUS) IMPLANT
CAPT HIP TOTAL 2 ×3 IMPLANT
CLOSURE WOUND 1/2 X4 (GAUZE/BANDAGES/DRESSINGS) ×1
COVER SURGICAL LIGHT HANDLE (MISCELLANEOUS) ×3 IMPLANT
DRAPE HALF SHEET 40X57 (DRAPES) ×3 IMPLANT
DRAPE INCISE IOBAN 66X45 STRL (DRAPES) IMPLANT
DRAPE ORTHO SPLIT 77X108 STRL (DRAPES) ×4
DRAPE SURG ORHT 6 SPLT 77X108 (DRAPES) ×2 IMPLANT
DRAPE U-SHAPE 47X51 STRL (DRAPES) ×3 IMPLANT
DRSG ADAPTIC 3X8 NADH LF (GAUZE/BANDAGES/DRESSINGS) ×3 IMPLANT
DRSG AQUACEL AG ADV 3.5X10 (GAUZE/BANDAGES/DRESSINGS) ×3 IMPLANT
DRSG PAD ABDOMINAL 8X10 ST (GAUZE/BANDAGES/DRESSINGS) ×6 IMPLANT
DURAPREP 26ML APPLICATOR (WOUND CARE) ×3 IMPLANT
ELECT BLADE 6.5 EXT (BLADE) ×3 IMPLANT
ELECT CAUTERY BLADE 6.4 (BLADE) ×3 IMPLANT
ELECT REM PT RETURN 9FT ADLT (ELECTROSURGICAL) ×3
ELECTRODE REM PT RTRN 9FT ADLT (ELECTROSURGICAL) ×1 IMPLANT
FACESHIELD WRAPAROUND (MASK) ×6 IMPLANT
GAUZE SPONGE 4X4 12PLY STRL (GAUZE/BANDAGES/DRESSINGS) ×3 IMPLANT
GLOVE BIOGEL PI IND STRL 8 (GLOVE) ×2 IMPLANT
GLOVE BIOGEL PI INDICATOR 8 (GLOVE) ×4
GLOVE ORTHO TXT STRL SZ7.5 (GLOVE) ×6 IMPLANT
GLOVE SURG ORTHO 8.0 STRL STRW (GLOVE) ×6 IMPLANT
GOWN STRL REUS W/ TWL LRG LVL3 (GOWN DISPOSABLE) ×1 IMPLANT
GOWN STRL REUS W/ TWL XL LVL3 (GOWN DISPOSABLE) ×1 IMPLANT
GOWN STRL REUS W/TWL 2XL LVL3 (GOWN DISPOSABLE) ×3 IMPLANT
GOWN STRL REUS W/TWL LRG LVL3 (GOWN DISPOSABLE) ×2
GOWN STRL REUS W/TWL XL LVL3 (GOWN DISPOSABLE) ×2
HANDPIECE INTERPULSE COAX TIP (DISPOSABLE)
HOOD PEEL AWAY FACE SHEILD DIS (HOOD) ×3 IMPLANT
IMMOBILIZER KNEE 22 UNIV (SOFTGOODS) ×3 IMPLANT
KIT BASIN OR (CUSTOM PROCEDURE TRAY) ×3 IMPLANT
KIT TURNOVER KIT B (KITS) ×3 IMPLANT
MANIFOLD NEPTUNE II (INSTRUMENTS) ×3 IMPLANT
NEEDLE 22X1 1/2 (OR ONLY) (NEEDLE) ×6 IMPLANT
NEEDLE MAYO TROCAR (NEEDLE) IMPLANT
NS IRRIG 1000ML POUR BTL (IV SOLUTION) ×3 IMPLANT
PACK TOTAL JOINT (CUSTOM PROCEDURE TRAY) ×3 IMPLANT
PACK UNIVERSAL I (CUSTOM PROCEDURE TRAY) ×3 IMPLANT
PAD ARMBOARD 7.5X6 YLW CONV (MISCELLANEOUS) ×6 IMPLANT
PRESSURIZER FEMORAL UNIV (MISCELLANEOUS) IMPLANT
SET HNDPC FAN SPRY TIP SCT (DISPOSABLE) IMPLANT
STAPLER VISISTAT 35W (STAPLE) ×3 IMPLANT
STRIP CLOSURE SKIN 1/2X4 (GAUZE/BANDAGES/DRESSINGS) ×2 IMPLANT
SUCTION FRAZIER HANDLE 10FR (MISCELLANEOUS) ×2
SUCTION TUBE FRAZIER 10FR DISP (MISCELLANEOUS) ×1 IMPLANT
SUT ETHIBOND 2 V 37 (SUTURE) ×3 IMPLANT
SUT MNCRL AB 3-0 PS2 18 (SUTURE) ×3 IMPLANT
SUT VIC AB 0 CT1 27 (SUTURE) ×4
SUT VIC AB 0 CT1 27XBRD ANBCTR (SUTURE) ×2 IMPLANT
SUT VIC AB 2-0 CT1 27 (SUTURE) ×4
SUT VIC AB 2-0 CT1 TAPERPNT 27 (SUTURE) ×2 IMPLANT
SYR CONTROL 10ML LL (SYRINGE) ×6 IMPLANT
TOWEL OR 17X24 6PK STRL BLUE (TOWEL DISPOSABLE) ×3 IMPLANT
TOWEL OR 17X26 10 PK STRL BLUE (TOWEL DISPOSABLE) ×3 IMPLANT
TOWER CARTRIDGE SMART MIX (DISPOSABLE) IMPLANT
TRAY CATH 16FR W/PLASTIC CATH (SET/KITS/TRAYS/PACK) IMPLANT
WATER STERILE IRR 1000ML POUR (IV SOLUTION) ×3 IMPLANT
YANKAUER SUCT BULB TIP NO VENT (SUCTIONS) ×3 IMPLANT

## 2017-12-05 NOTE — Transfer of Care (Signed)
Immediate Anesthesia Transfer of Care Note  Patient: [name]  Procedure(s) Performed: TOTAL HIP ARTHROPLASTY (Right )  Patient Location: PACU  Anesthesia Type:MAC and Spinal  Level of Consciousness: oriented, drowsy and patient cooperative  Airway & Oxygen Therapy: Patient Spontanous Breathing and Patient connected to face mask oxygen  Post-op Assessment: Report given to RN and Post -op Vital signs reviewed and stable  Post vital signs: Reviewed  Last Vitals:  Vitals Value Taken Time  BP 95/64 12/05/2017 10:06 AM  Temp    Pulse 56 12/05/2017 10:07 AM  Resp 14 12/05/2017 10:08 AM  SpO2 100 % 12/05/2017 10:07 AM  Vitals shown include unvalidated device data.  Last Pain:  Vitals:   12/05/17 0644  TempSrc:   PainSc: 0-No pain         Complications: No apparent anesthesia complications

## 2017-12-05 NOTE — Interval H&P Note (Signed)
History and Physical Interval Note:  12/05/2017 7:30 AM  [name]  has presented today for surgery, with the diagnosis of OA RIGHT HIP  The various methods of treatment have been discussed with the patient and family. After consideration of risks, benefits and other options for treatment, the patient has consented to  Procedure(s): TOTAL HIP ARTHROPLASTY (Right) as a surgical intervention .  The patient's history has been reviewed, patient examined, no change in status, stable for surgery.  I have reviewed the patient's chart and labs.  Questions were answered to the patient's satisfaction.     Yvette Rack

## 2017-12-05 NOTE — Op Note (Signed)
NAMEGEARALD, [name] MEDICAL RECORD OM:[id number] ACCOUNT [id number] DATE OF BIRTH:1965-08-08 FACILITY: [facility] LOCATION: [facility] PHYSICIAN:W. Mailee Klaas JR., MD  OPERATIVE REPORT  DATE OF PROCEDURE:  12/05/2017  PREOPERATIVE DIAGNOSIS:  Severe osteoarthritis, right hip.  POSTOPERATIVE DIAGNOSIS:  Severe osteoarthritis, right hip.  OPERATION:  Right total hip replacement (DePuy 16.5 mm large stature stem with -2 neck length with a 36 mm metal head, 60 mm acetabulum with Pinnacle Gription cup with Altrx polyethylene +4 degree 10-degree liner).  SURGEON:  W Carmine Savoy., MD  ASSISTANTLevester Fresh, PA  ESTIMATED BLOOD LOSS:  400.  DESCRIPTION OF PERFORMED:  Posterior approach to the hip made, splitting the iliotibial band and gluteus maximus fascia.  We split the external rotators.  T capsulotomy was done of the hip.  The hip was severely arthritic and stiff.  We did dislocate the  hip.  A rather large femoral head was encountered.  We cut the head about 1 fingerbreadth above the lesser troch and progressively reamed and broached to accept a 16.5 mm large stature stem.  Acetabulum was exposed with retractors anteriorly and inferiorly and wing retractors superiorly and posteriorly.  A large acetabulum was encountered.  We deepened somewhat.  A large amount of osteophytes were encountered.  It was reamed in about 15-20  degrees of anteversion with about 45-50 degrees of abduction on the cup.  A trial was placed, and then it was elected then to put the final prosthesis in in the same angle.  A trial lip liner was placed with a trial broach.  We tried the broach with a  trial and elected at that point to consider a +1.5 mm length with the broach in.  We then placed the final polyethylene liner with the buildup at about the 9 o'clock position and then placed the final prosthesis.  Although the broach went down without  difficulty, during the insertion of the final prosthesis, a  small calcar crack was noted with the prosthesis remaining probably about a centimeter or 2 proud and we elected at that point that excellent fit was obtained.  I was concerned that this would  propagate if we placed the stem more distal.  We then elected to use the -2 tapered head in view of that 36 mm head.  All parameters were trialled with the patient noted to be extremely stable.  Good restoration of the leg lengths with no tendency to  dislocate whatsoever except at extreme flexion, internal rotation and adduction.  The actual internal rotation required to dislocate the hip was past 90 degrees.  The wound was irrigated.  The capsule had been split.  It was closed with #1 Ethibond, the  subcutaneous tissues with Vicryl, running Ethibond on the fascia was closed and the skin with a stapling device.  A lightly compressive sterile dressing applied and a knee immobilizer.  Additionally, Exparel with Marcaine infiltrated into the wound.  LN/NUANCE  D:12/05/2017 T:12/05/2017 JOB:[id number]

## 2017-12-05 NOTE — Anesthesia Postprocedure Evaluation (Signed)
Anesthesia Post Note  Patient: [name]  Procedure(s) Performed: TOTAL HIP ARTHROPLASTY (Right )     Patient location during evaluation: PACU Anesthesia Type: Spinal Level of consciousness: awake and alert Pain management: pain level controlled Vital Signs Assessment: post-procedure vital signs reviewed and stable Respiratory status: spontaneous breathing and respiratory function stable Cardiovascular status: blood pressure returned to baseline and stable Postop Assessment: spinal receding Anesthetic complications: no    Last Vitals:  Vitals:   12/05/17 1105 12/05/17 1110  BP: (!) 121/96   Pulse: (!) 55 (!) 51  Resp: 12 11  Temp:    SpO2: 100% 100%    Last Pain:  Vitals:   12/05/17 1020  TempSrc:   PainSc: 0-No pain                 [name]

## 2017-12-05 NOTE — Evaluation (Signed)
Physical Therapy Evaluation Patient Details Name: [name] MRN: [id number] DOB: [birth year] Today's Date: 12/05/2017   History of Present Illness  Patient is a 52 y.o. M with significant PMH of stroke who is s/p right total hip arthroplasty.  Clinical Impression  Pt is s/p THA resulting in the deficits listed below (see PT Problem List). Patient able to ambulate from bed to chair with walker and min assist. Reviewed precautions, therapeutic exercises, and HEP (written handouts provided). Suspect patient will progress very well and be an excellent candidate for outpatient PT for further functional strengthening and gait training. Pt will benefit from skilled PT to increase their independence and safety with mobility to allow discharge to the venue listed below.      Follow Up Recommendations Follow surgeon's recommendation for DC plan and follow-up therapies    Equipment Recommendations  Rolling walker with 5" wheels    Recommendations for Other Services       Precautions / Restrictions Precautions Precautions: Posterior Hip Precaution Booklet Issued: Yes (comment) Precaution Comments: patient able to recall 1/3 precautions; reviewed Restrictions Weight Bearing Restrictions: Yes RLE Weight Bearing: Weight bearing as tolerated      Mobility  Bed Mobility Overal bed mobility: Needs Assistance Bed Mobility: Supine to Sit     Supine to sit: Supervision        Transfers Overall transfer level: Needs assistance Equipment used: Rolling walker (2 wheeled) Transfers: Sit to/from Stand Sit to Stand: Min guard         General transfer comment: patient requiring hands on min guard assist to steady. decreased RLE weightbearing  Ambulation/Gait Ambulation/Gait assistance: Min assist   Assistive device: Rolling walker (2 wheeled) Gait Pattern/deviations: Step-to pattern;Decreased weight shift to right;Antalgic     General Gait Details: patient able to ambulate from  bed to recliner with RW. up to min assist required to steady. cues for sequencing  Stairs            Wheelchair Mobility    Modified Rankin (Stroke Patients Only)       Balance Overall balance assessment: Needs assistance Sitting-balance support: No upper extremity supported;Feet supported Sitting balance-Leahy Scale: Good     Standing balance support: Bilateral upper extremity supported Standing balance-Leahy Scale: Poor                               Pertinent Vitals/Pain Pain Assessment: 0-10 Pain Score: 4  Pain Location: surgical site Pain Descriptors / Indicators: Guarding Pain Intervention(s): Limited activity within patient's tolerance;Monitored during session    Home Living Family/patient expects to be discharged to:: Private residence Living Arrangements: Spouse/significant other;Children(wife, daughter, son) Available Help at Discharge: Family;Available 24 hours/day Type of Home: House Home Access: Stairs to enter Entrance Stairs-Rails: Can reach both Entrance Stairs-Number of Steps: 2 Home Layout: One level Home Equipment: None      Prior Function Level of Independence: Independent         Comments: Works for [employer]   Dominant Hand: Right    Extremity/Trunk Assessment   Upper Extremity Assessment Upper Extremity Assessment: Overall WFL for tasks assessed    Lower Extremity Assessment Lower Extremity Assessment: RLE deficits/detail RLE Deficits / Details: expected post op deficits.    Cervical / Trunk Assessment Cervical / Trunk Assessment: Normal  Communication   Communication: No difficulties  Cognition Arousal/Alertness: Awake/alert Behavior During Therapy: WFL for tasks assessed/performed Overall Cognitive Status:  Within Functional Limits for tasks assessed                                        General Comments      Exercises Total Joint Exercises Ankle Circles/Pumps: 10  reps;Both;Supine Quad Sets: 10 reps;Right;Supine Gluteal Sets: 10 reps;Supine   Assessment/Plan    PT Assessment Patient needs continued PT services  PT Problem List Decreased strength;Decreased activity tolerance;Decreased balance;Decreased range of motion;Decreased mobility;Pain       PT Treatment Interventions DME instruction;Gait training;Stair training;Functional mobility training;Therapeutic activities;Therapeutic exercise;Balance training;Patient/family education;Neuromuscular re-education    PT Goals (Current goals can be found in the Care Plan section)  Acute Rehab PT Goals Patient Stated Goal: get strength back PT Goal Formulation: With patient Time For Goal Achievement: 12/10/17 Potential to Achieve Goals: Good    Frequency 7X/week   Barriers to discharge        Co-evaluation               AM-PAC PT "6 Clicks" Daily Activity  Outcome Measure Difficulty turning over in bed (including adjusting bedclothes, sheets and blankets)?: None Difficulty moving from lying on back to sitting on the side of the bed? : A Little Difficulty sitting down on and standing up from a chair with arms (e.g., wheelchair, bedside commode, etc,.)?: A Little Help needed moving to and from a bed to chair (including a wheelchair)?: A Little Help needed walking in hospital room?: A Little Help needed climbing 3-5 steps with a railing? : A Lot 6 Click Score: 18    End of Session Equipment Utilized During Treatment: Gait belt;Right knee immobilizer Activity Tolerance: Patient tolerated treatment well Patient left: in chair;with call bell/phone within reach Nurse Communication: Mobility status PT Visit Diagnosis: Unsteadiness on feet (R26.81);Difficulty in walking, not elsewhere classified (R26.2);Pain Pain - Right/Left: Right Pain - part of body: Hip    Time: 1430-1505 PT Time Calculation (min) (ACUTE ONLY): 35 min   Charges:   PT Evaluation $PT Eval Low Complexity: 1 Low PT  Treatments $Therapeutic Activity: 8-22 mins   PT G Codes:        Ellamae Sia, PT, DPT Acute Rehabilitation Services  Pager: [place] 12/05/2017, 5:15 PM

## 2017-12-06 LAB — BASIC METABOLIC PANEL
ANION GAP: 9 (ref 5–15)
BUN: 10 mg/dL (ref 6–20)
CHLORIDE: 101 mmol/L (ref 101–111)
CO2: 26 mmol/L (ref 22–32)
Calcium: 8.9 mg/dL (ref 8.9–10.3)
Creatinine, Ser: 1.05 mg/dL (ref 0.61–1.24)
GFR calc non Af Amer: >60 mL/min (ref >60)
Glucose, Bld: 116 mg/dL — ABNORMAL HIGH (ref 65–99)
POTASSIUM: 4.2 mmol/L (ref 3.5–5.1)
SODIUM: 136 mmol/L (ref 135–145)

## 2017-12-06 LAB — CBC
HCT: 37.4 % — ABNORMAL LOW (ref 39.0–52.0)
HEMOGLOBIN: 13.1 g/dL (ref 13.0–17.0)
MCH: 31.6 pg (ref 26.0–34.0)
MCHC: 35 g/dL (ref 30.0–36.0)
MCV: 90.1 fL (ref 78.0–100.0)
Platelets: 134 10*3/uL — ABNORMAL LOW (ref 150–400)
RBC: 4.15 MIL/uL — AB (ref 4.22–5.81)
RDW: 12.5 % (ref 11.5–15.5)
WBC: 7.2 10*3/uL (ref 4.0–10.5)

## 2017-12-06 NOTE — Care Management Note (Signed)
Case Management Note  Patient Details  Name: Inocente Krach MRN: [id number] Date of Birth: [birth year]  Subjective/Objective:   52 yr old gentleman s/p right total hip arthroplasty.                 Action/Plan: Case manager spoke with patient concerning discharge plan and DME. Patient was preoperatively setup with [facility], no changes` durable medical equipment has been requested . Patient says he will have support from his brother at discharge.   Expected Discharge Date:  12/05/17               Expected Discharge Plan:  [place]  In-House Referral:  NA  Discharge planning Services  CM Consult  Post Acute Care Choice:  Durable Medical Equipment, Home Health Choice offered to:  Patient  DME Arranged:  3-N-1, Walker rolling DME Agency:  [place]:  [place] Agency:  [facility] (formerly [facility])  Status of Service:  Completed, signed off  If discussed at [employer], dates discussed:    Additional Comments:  Ninfa Meeker, RN 12/06/2017, 1:28 PM

## 2017-12-06 NOTE — Progress Notes (Signed)
Physical Therapy Treatment Patient Details Name: [name] MRN: [id number] DOB: 11-23-1965 Today's Date: 12/06/2017    History of Present Illness Patient is a 52 y.o. M with significant PMH of stroke who is s/p right total hip arthroplasty.    PT Comments    Pt is progressing well towards mobility goals and tolerated increased ambulation distance. Reviewed posterior hip precautions and supine HEP. Plan to see again in PM prior to d/c to complete HEP and stair training.   Follow Up Recommendations  Follow surgeon's recommendation for DC plan and follow-up therapies     Equipment Recommendations  Rolling walker with 5" wheels    Recommendations for Other Services       Precautions / Restrictions Precautions Precautions: Posterior Hip Precaution Booklet Issued: Yes (comment) Precaution Comments: Reviewed post hip precautions and positioning. Pt reports KI is on at all times. Restrictions Weight Bearing Restrictions: Yes RLE Weight Bearing: Weight bearing as tolerated    Mobility  Bed Mobility Overal bed mobility: Needs Assistance Bed Mobility: Supine to Sit     Supine to sit: Supervision     General bed mobility comments: Pt able to hook L LE under R to progress to EOB  Transfers Overall transfer level: Needs assistance Equipment used: Rolling walker (2 wheeled) Transfers: Sit to/from Stand Sit to Stand: Min guard;From elevated surface         General transfer comment: Min guard for safety from elevated surface as pt is 6'7".  Ambulation/Gait Ambulation/Gait assistance: Min guard Gait Distance (Feet): 125 Feet Assistive device: Rolling walker (2 wheeled) Gait Pattern/deviations: Step-to pattern;Decreased weight shift to right;Antalgic;Trunk flexed     General Gait Details: Cues for postural control and RW proximity. Pt with limited distance secondary to nausea.    Stairs             Wheelchair Mobility    Modified Rankin (Stroke Patients  Only)       Balance Overall balance assessment: Needs assistance Sitting-balance support: No upper extremity supported;Feet supported Sitting balance-Leahy Scale: Good     Standing balance support: Bilateral upper extremity supported Standing balance-Leahy Scale: Poor Standing balance comment: reliant on UE support                            Cognition Arousal/Alertness: Awake/alert Behavior During Therapy: WFL for tasks assessed/performed Overall Cognitive Status: Within Functional Limits for tasks assessed                                        Exercises Total Joint Exercises Short Arc Quad: AROM;Right;10 reps;Supine Hip ABduction/ADduction: AROM;Right;10 reps;Supine    General Comments        Pertinent Vitals/Pain Pain Assessment: Faces Faces Pain Scale: Hurts even more Pain Location: surgical site Pain Descriptors / Indicators: Guarding Pain Intervention(s): Monitored during session;Limited activity within patient's tolerance;Repositioned    Home Living                      Prior Function            PT Goals (current goals can now be found in the care plan section) Acute Rehab PT Goals Patient Stated Goal: get strength back PT Goal Formulation: With patient Time For Goal Achievement: 12/10/17 Potential to Achieve Goals: Good Progress towards PT goals: Progressing toward goals    Frequency  7X/week      PT Plan Current plan remains appropriate    Co-evaluation              AM-PAC PT "6 Clicks" Daily Activity  Outcome Measure  Difficulty turning over in bed (including adjusting bedclothes, sheets and blankets)?: None Difficulty moving from lying on back to sitting on the side of the bed? : None Difficulty sitting down on and standing up from a chair with arms (e.g., wheelchair, bedside commode, etc,.)?: Unable Help needed moving to and from a bed to chair (including a wheelchair)?: A Little Help needed  walking in hospital room?: A Little Help needed climbing 3-5 steps with a railing? : A Little 6 Click Score: 18    End of Session Equipment Utilized During Treatment: Gait belt;Right knee immobilizer Activity Tolerance: Patient tolerated treatment well Patient left: in chair;with call bell/phone within reach Nurse Communication: Mobility status PT Visit Diagnosis: Unsteadiness on feet (R26.81);Difficulty in walking, not elsewhere classified (R26.2);Pain Pain - Right/Left: Right Pain - part of body: Hip     Time: 2426-8341 PT Time Calculation (min) (ACUTE ONLY): 23 min  Charges:  $Gait Training: 8-22 mins $Therapeutic Exercise: 8-22 mins                    G Codes:       Benjiman Core, [place] Pager [phone] Acute Rehab   Allena Katz 12/06/2017, 10:23 AM

## 2017-12-06 NOTE — Progress Notes (Addendum)
Patient ID: [name], male   DOB: Jul 29, 1965, 52 y.o.   MRN: [id number]     Subjective:  Patient reports pain as mild.  Patient wants to go home  Objective:   VITALS:   Vitals:   12/05/17 2327 12/06/17 0554 12/06/17 0757 12/06/17 1146  BP: (!) 113/95 136/87 132/83 (!) 116/107  Pulse: 77 76 74 64  Resp: 18  20 20   Temp: 98.3 F (36.8 C) 98.4 F (36.9 C) 98.3 F (36.8 C) 98.3 F (36.8 C)  TempSrc: Oral Oral Oral Oral  SpO2: 100% 100% 100% 100%  Weight:      Height:        ABD soft Sensation intact distally Dorsiflexion/Plantar flexion intact Incision: dressing C/D/I and no drainage   Lab Results  Component Value Date   WBC 7.2 12/06/2017   HGB 13.1 12/06/2017   HCT 37.4 (L) 12/06/2017   MCV 90.1 12/06/2017   PLT 134 (L) 12/06/2017   BMET    Component Value Date/Time   NA 136 12/06/2017 0552   K 4.2 12/06/2017 0552   CL 101 12/06/2017 0552   CO2 26 12/06/2017 0552   GLUCOSE 116 (H) 12/06/2017 0552   BUN 10 12/06/2017 0552   CREATININE 1.05 12/06/2017 0552   CREATININE 1.15 10/17/2017 1644   CALCIUM 8.9 12/06/2017 0552   GFRNONAA >60 12/06/2017 0552   GFRAA >60 12/06/2017 0552     Assessment/Plan: 1 Day Post-Op   Active Problems:   Primary localized osteoarthritis of right hip   Advance diet Up with therapy Discharge home with home health WBAT Dry dressing PRN Follow up with Dr Salem Senate, Erlene Quan 12/06/2017, 2:17 PM  Discussed and agree with above.   Marchia Bond, MD Cell [phone]

## 2017-12-06 NOTE — Progress Notes (Signed)
Pt continues to tolerate increased gait distance and demonstrates improved mobility. This PM session focused on stair training and completing HEP. Pt expected to d/c home this PM. He would benefit from continued skilled PT after d/c to maximize functional independence and activity tolerance.   Benjiman Core, [place] Pager [phone] Acute Rehab    12/06/17 1300  PT Visit Information  Last PT Received On 12/06/17  Assistance Needed +1  History of Present Illness Patient is a 52 y.o. M with significant PMH of stroke who is s/p right total hip arthroplasty.  Subjective Data  Patient Stated Goal get strength back  Precautions  Precautions Posterior Hip  Precaution Booklet Issued Yes (comment)  Precaution Comments Able to recall 1/3 hip precautions  Restrictions  Weight Bearing Restrictions Yes  RLE Weight Bearing WBAT  Pain Assessment  Pain Assessment Faces  Faces Pain Scale 4  Pain Location surgical site  Pain Descriptors / Indicators Guarding  Pain Intervention(s) Monitored during session;Limited activity within patient's tolerance  Cognition  Arousal/Alertness Awake/alert  Behavior During Therapy WFL for tasks assessed/performed  Overall Cognitive Status Within Functional Limits for tasks assessed  Bed Mobility  Overal bed mobility Needs Assistance  Bed Mobility Supine to Sit;Sit to Supine  Supine to sit Supervision  Sit to supine Min assist  General bed mobility comments Min A to progress R LE onto bed.  Transfers  Overall transfer level Needs assistance  Equipment used Rolling walker (2 wheeled)  Transfers Sit to/from Stand  Sit to Stand Min guard;From elevated surface  General transfer comment Min guard for safety from elevated surface as pt is 6'7".  Ambulation/Gait  Ambulation/Gait assistance Supervision  Gait Distance (Feet) 200 Feet  Assistive device Rolling walker (2 wheeled)  Gait Pattern/deviations Decreased weight shift to right;Antalgic;Trunk flexed;Step-through  pattern  General Gait Details Pt with slow mildly antalgic gait. Overall steady with RW. Cues for postural control.   Stairs Yes  Stairs assistance Min guard  Stair Management Two rails;Step to pattern;Forwards  Number of Stairs 2  General stair comments Min gaurd for safety. Cues for technique. Pt descends leading with weak side, stating it is more comfortable.  Balance  Overall balance assessment Needs assistance  Sitting-balance support No upper extremity supported;Feet supported  Sitting balance-Leahy Scale Good  Standing balance support Bilateral upper extremity supported  Standing balance-Leahy Scale Poor  Standing balance comment reliant on UE support  Exercises  Exercises Total Joint  Total Joint Exercises  Heel Slides AROM;Right;10 reps;Standing  Hip ABduction/ADduction AROM;Right;10 reps;Standing  Knee Flexion AROM;Right;10 reps;Standing  Marching in Standing AROM;Both;10 reps;Standing  PT - End of Session  Equipment Utilized During Treatment Gait belt;Right knee immobilizer  Activity Tolerance Patient tolerated treatment well  Patient left with call bell/phone within reach;in bed  Nurse Communication Mobility status   PT - Assessment/Plan  PT Plan Current plan remains appropriate  PT Visit Diagnosis Unsteadiness on feet (R26.81);Difficulty in walking, not elsewhere classified (R26.2);Pain  Pain - Right/Left Right  Pain - part of body Hip  PT Frequency (ACUTE ONLY) 7X/week  Follow Up Recommendations Follow surgeon's recommendation for DC plan and follow-up therapies  PT equipment Rolling walker with 5" wheels  AM-PAC PT "6 Clicks" Daily Activity Outcome Measure  Difficulty turning over in bed (including adjusting bedclothes, sheets and blankets)? 4  Difficulty moving from lying on back to sitting on the side of the bed?  4  Difficulty sitting down on and standing up from a chair with arms (e.g., wheelchair, bedside commode, etc,.)?  1  Help needed moving to and from a  bed to chair (including a wheelchair)? 3  Help needed walking in hospital room? 3  Help needed climbing 3-5 steps with a railing?  3  6 Click Score 18  Mobility G Code  CK  PT Goal Progression  Progress towards PT goals Progressing toward goals  Acute Rehab PT Goals  PT Goal Formulation With patient  Time For Goal Achievement 12/10/17  Potential to Achieve Goals Good  PT Time Calculation  PT Start Time (ACUTE ONLY) 1254  PT Stop Time (ACUTE ONLY) 1327  PT Time Calculation (min) (ACUTE ONLY) 33 min  PT General Charges  $$ ACUTE PT VISIT 1 Visit

## 2017-12-06 NOTE — Plan of Care (Signed)
  Problem: Education: Goal: Knowledge of General Education information will improve 12/06/2017 1610 by Rance Muir, RN Outcome: Adequate for Discharge 12/06/2017 0846 by Rance Muir, RN Outcome: Progressing

## 2017-12-06 NOTE — Plan of Care (Signed)
  Problem: Education: Goal: Knowledge of General Education information will improve Outcome: Progressing   

## 2017-12-08 ENCOUNTER — Encounter (HOSPITAL_COMMUNITY): Payer: Self-pay | Admitting: Orthopedic Surgery

## 2017-12-08 LAB — TYPE AND SCREEN
ABO/RH(D): AB POS
Antibody Screen: POSITIVE
DAT, IgG: POSITIVE
Unit division: 0
Unit division: 0

## 2017-12-08 LAB — BPAM RBC
BLOOD PRODUCT EXPIRATION DATE: 201907042359
BLOOD PRODUCT EXPIRATION DATE: 201907042359
Unit Type and Rh: 6200
Unit Type and Rh: 6200

## 2017-12-08 NOTE — Discharge Summary (Signed)
Patient ID: [name] MRN: [id number] DOB/AGE: 1965/09/04 52 y.o.  Admit date: 12/05/2017 Discharge date: 12/06/2017  Admission Diagnoses:  Active Problems:   Primary localized osteoarthritis of right hip   Discharge Diagnoses:  Same  Past Medical History:  Diagnosis Date  . High cholesterol   . Stroke [facility])     Surgeries: Procedure(s): TOTAL HIP ARTHROPLASTY on 12/05/2017   Consultants:   Discharged Condition: Improved  Hospital Course: [name] is an 52 y.o. male who was admitted 12/05/2017 for operative treatment of<principal problem not specified>. Patient has severe unremitting pain that affects sleep, daily activities, and work/hobbies. After pre-op clearance the patient was taken to the operating room on 12/05/2017 and underwent  Procedure(s): TOTAL HIP ARTHROPLASTY.    Patient was given perioperative antibiotics:  Anti-infectives (From admission, onward)   Start     Dose/Rate Route Frequency Ordered Stop   12/05/17 1600  ceFAZolin (ANCEF) IVPB 2g/100 mL premix     2 g 200 mL/hr over 30 Minutes Intravenous Every 6 hours 12/05/17 1455 12/05/17 2257   12/05/17 0645  ceFAZolin (ANCEF) IVPB 2g/100 mL premix     2 g 200 mL/hr over 30 Minutes Intravenous On call to O.R. 12/05/17 1740 12/05/17 0805   12/05/17 8144  ceFAZolin (ANCEF) 2-4 GM/100ML-% IVPB    Note to Pharmacy:  Harden Mo   : cabinet override      12/05/17 0633 12/05/17 0750       Patient was given sequential compression devices, early ambulation, and chemoprophylaxis to prevent DVT.  Patient benefited maximally from hospital stay and there were no complications.    Recent vital signs: No data found.   Recent laboratory studies:  Recent Labs    12/06/17 0552  WBC 7.2  HGB 13.1  HCT 37.4*  PLT 134*  NA 136  K 4.2  CL 101  CO2 26  BUN 10  CREATININE 1.05  GLUCOSE 116*  CALCIUM 8.9     Discharge Medications:   Allergies as of 12/06/2017   No Known Allergies      Medication List    TAKE these medications   aspirin 325 MG EC tablet Take 325 mg by mouth daily. What changed:  Another medication with the same name was added. Make sure you understand how and when to take each.   aspirin EC 325 MG tablet 1 tab a day for the next 30 days to prevent blood clots What changed:  You were already taking a medication with the same name, and this prescription was added. Make sure you understand how and when to take each.   docusate sodium 100 MG capsule Commonly known as:  COLACE 1 tab 2 times a day while on narcotics.  STOOL SOFTENER   gabapentin 300 MG capsule Commonly known as:  NEURONTIN 1 po qhs for nerve pain   oxyCODONE 5 MG immediate release tablet Commonly known as:  Oxy IR/ROXICODONE 1 po q 4 hrs prn pain   Patient had a total hip replacement on 12/05/2017   polyethylene glycol packet Commonly known as:  MIRALAX 17 grams in 6 oz of favorite drink twice a day until bowel movement.  LAXITIVE.  Restart if two days since last bowel movement       Diagnostic Studies: Dg Chest 2 View  Result Date: 12/04/2017 CLINICAL DATA:  Preoperative total hip replacement EXAM: CHEST - 2 VIEW COMPARISON:  None. FINDINGS: Lungs are slightly hyperexpanded. No edema or consolidation. Heart size and pulmonary vascularity are  normal. No adenopathy. No bone lesions. IMPRESSION: Lungs slightly hyperexpanded. No edema or consolidation. Heart size within normal limits. Electronically Signed   By: Lowella Grip III M.D.   On: 12/04/2017 09:43   Dg Hip Port Unilat With Pelvis 1v Right  Result Date: 12/05/2017 CLINICAL DATA:  Status post right hip replacement EXAM: DG HIP (WITH OR WITHOUT PELVIS) 1V PORT RIGHT COMPARISON:  None. FINDINGS: Right hip prosthesis is noted. No acute bony abnormality is seen. Significant degenerative change in the left hip is noted as well. IMPRESSION: Status post right hip replacement Electronically Signed   By: Inez Catalina M.D.   On:  12/05/2017 11:03    Disposition:   Discharge Instructions    Call MD / Call 911   Complete by:  As directed    If you experience chest pain or shortness of breath, CALL 911 and be transported to the hospital emergency room.  If you develope a fever above 101 F, pus (white drainage) or increased drainage or redness at the wound, or calf pain, call your surgeon's office.   Constipation Prevention   Complete by:  As directed    Drink plenty of fluids.  Prune juice may be helpful.  You may use a stool softener, such as Colace (over the counter) 100 mg twice a day.  Use MiraLax (over the counter) for constipation as needed.   Diet - low sodium heart healthy   Complete by:  As directed    Discharge instructions   Complete by:  As directed    INSTRUCTIONS AFTER JOINT REPLACEMENT   Remove items at home which could result in a fall. This includes throw rugs or furniture in walking pathways ICE to the affected joint every three hours while awake for 30 minutes at a time, for at least the first 3-5 days, and then as needed for pain and swelling.  Continue to use ice for pain and swelling. You may notice swelling that will progress down to the foot and ankle.  This is normal after surgery.  Elevate your leg when you are not up walking on it.   Continue to use the breathing machine you got in the hospital (incentive spirometer) which will help keep your temperature down.  It is common for your temperature to cycle up and down following surgery, especially at night when you are not up moving around and exerting yourself.  The breathing machine keeps your lungs expanded and your temperature down.   DIET:  As you were doing prior to hospitalization, we recommend a well-balanced diet.  DRESSING / WOUND CARE / SHOWERING  Keep the surgical dressing until follow up.  The dressing is water proof, so you can shower without any extra covering.  IF THE DRESSING FALLS OFF or the wound gets wet inside, change the  dressing with sterile gauze.  Please use good hand washing techniques before changing the dressing.  Do not use any lotions or creams on the incision until instructed by your surgeon.    ACTIVITY  Increase activity slowly as tolerated, but follow the weight bearing instructions below.   No driving for 6 weeks or until further direction given by your physician.  You cannot drive while taking narcotics.  No lifting or carrying greater than 10 lbs. until further directed by your surgeon. Avoid periods of inactivity such as sitting longer than an hour when not asleep. This helps prevent blood clots.  You may return to work once you are authorized by your  doctor.     WEIGHT BEARING   Weight bearing as tolerated with assist device (walker, cane, etc) as directed, use it as long as suggested by your surgeon or therapist, typically at least 2-3 weeks.   EXERCISES  Results after joint replacement surgery are often greatly improved when you follow the exercise, range of motion and muscle strengthening exercises prescribed by your doctor. Safety measures are also important to protect the joint from further injury. Any time any of these exercises cause you to have increased pain or swelling, decrease what you are doing until you are comfortable again and then slowly increase them. If you have problems or questions, call your caregiver or physical therapist for advice.   Rehabilitation is important following a joint replacement. After just a few days of immobilization, the muscles of the leg can become weakened and shrink (atrophy).  These exercises are designed to build up the tone and strength of the thigh and leg muscles and to improve motion. Often times heat used for twenty to thirty minutes before working out will loosen up your tissues and help with improving the range of motion but do not use heat for the first two weeks following surgery (sometimes heat can increase post-operative swelling).    These exercises can be done on a training (exercise) mat, on the floor, on a table or on a bed. Use whatever works the best and is most comfortable for you.    Use music or television while you are exercising so that the exercises are a pleasant break in your day. This will make your life better with the exercises acting as a break in your routine that you can look forward to.   Perform all exercises about fifteen times, three times per day or as directed.  You should exercise both the operative leg and the other leg as well.   Exercises include:  Quad Sets - Tighten up the muscle on the front of the thigh (Quad) and hold for 5-10 seconds.   Straight Leg Raises - With your knee straight (if you were given a brace, keep it on), lift the leg to 60 degrees, hold for 3 seconds, and slowly lower the leg.  Perform this exercise against resistance later as your leg gets stronger.  Leg Slides: Lying on your back, slowly slide your foot toward your buttocks, bending your knee up off the floor (only go as far as is comfortable). Then slowly slide your foot back down until your leg is flat on the floor again.  Angel Wings: Lying on your back spread your legs to the side as far apart as you can without causing discomfort.  Hamstring Strength:  Lying on your back, push your heel against the floor with your leg straight by tightening up the muscles of your buttocks.  Repeat, but this time bend your knee to a comfortable angle, and push your heel against the floor.  You may put a pillow under the heel to make it more comfortable if necessary.   A rehabilitation program following joint replacement surgery can speed recovery and prevent re-injury in the future due to weakened muscles. Contact your doctor or a physical therapist for more information on knee rehabilitation.    CONSTIPATION  Constipation is defined medically as fewer than three stools per week and severe constipation as less than one stool per week.   Even if you have a regular bowel pattern at home, your normal regimen is likely to be disrupted due to  multiple reasons following surgery.  Combination of anesthesia, postoperative narcotics, change in appetite and fluid intake all can affect your bowels.   YOU MUST use at least one of the following options; they are listed in order of increasing strength to get the job done.  They are all available over the counter, and you may need to use some, POSSIBLY even all of these options:    Drink plenty of fluids (prune juice may be helpful) and high fiber foods Colace 100 mg by mouth twice a day  Senokot for constipation as directed and as needed Dulcolax (bisacodyl), take with full glass of water  Miralax (polyethylene glycol) once or twice a day as needed.  If you have tried all these things and are unable to have a bowel movement in the first 3-4 days after surgery call either your surgeon or your primary doctor.    If you experience loose stools or diarrhea, hold the medications until you stool forms back up.  If your symptoms do not get better within 1 week or if they get worse, check with your doctor.  If you experience "the worst abdominal pain ever" or develop nausea or vomiting, please contact the office immediately for further recommendations for treatment.   ITCHING:  If you experience itching with your medications, try taking only a single pain pill, or even half a pain pill at a time.  You can also use Benadryl over the counter for itching or also to help with sleep.   TED HOSE STOCKINGS:  Use stockings on both legs until for at least 2 weeks or as directed by physician office. They may be removed at night for sleeping.  MEDICATIONS:  See your medication summary on the "After Visit Summary" that nursing will review with you.  You may have some home medications which will be placed on hold until you complete the course of blood thinner medication.  It is important for you to complete the  blood thinner medication as prescribed.  PRECAUTIONS:  If you experience chest pain or shortness of breath - call 911 immediately for transfer to the hospital emergency department.   If you develop a fever greater that 101 F, purulent drainage from wound, increased redness or drainage from wound, foul odor from the wound/dressing, or calf pain - CONTACT YOUR SURGEON.                                                   FOLLOW-UP APPOINTMENTS:  If you do not already have a post-op appointment, please call the office for an appointment to be seen by your surgeon.  Guidelines for how soon to be seen are listed in your "After Visit Summary", but are typically between 1-4 weeks after surgery.   MAKE SURE YOU:  Understand these instructions.  Get help right away if you are not doing well or get worse.    Thank you for letting us be a part of your medical care team.  It is a privilege we respect greatly.  We hope these instructions will help you stay on track for a fast and full recovery!   Do not sit on low chairs, stoools or toilet seats, as it may be difficult to get up from low surfaces   Complete by:  As directed    Follow the hip precautions as taught  in Physical Therapy   Complete by:  As directed    Increase activity slowly as tolerated   Complete by:  As directed    Patient may shower   Complete by:  As directed    Aquacel dressing is water proof    Wash over it and the whole leg with soap and water at the end of your shower      Follow-up Information    Earlie Server, MD On 12/18/2017.   Specialty:  Orthopedic Surgery Why:  appointment time 2 pm Contact information: [address] [place] [address] [phone]        [facility] Follow up.   Specialty:  [place] Why:  A representative from [facility] will contact you to arrange strt date and time for your therapy. Contact information: [address] [place]  [address] [phone]            Signed: Linda Hedges 12/08/2017, 10:18 AM

## 2018-07-03 ENCOUNTER — Ambulatory Visit (INDEPENDENT_AMBULATORY_CARE_PROVIDER_SITE_OTHER): Payer: Managed Care, Other (non HMO) | Admitting: Family Medicine

## 2018-07-03 ENCOUNTER — Encounter: Payer: Self-pay | Admitting: Family Medicine

## 2018-07-03 VITALS — BP 102/78 | HR 64 | Temp 98.6°F | Wt 156.0 lb

## 2018-07-03 DIAGNOSIS — Z23 Encounter for immunization: Secondary | ICD-10-CM | POA: Diagnosis not present

## 2018-07-03 DIAGNOSIS — N401 Enlarged prostate with lower urinary tract symptoms: Secondary | ICD-10-CM

## 2018-07-03 DIAGNOSIS — R3914 Feeling of incomplete bladder emptying: Secondary | ICD-10-CM

## 2018-07-03 DIAGNOSIS — Z1211 Encounter for screening for malignant neoplasm of colon: Secondary | ICD-10-CM | POA: Diagnosis not present

## 2018-07-03 DIAGNOSIS — Z131 Encounter for screening for diabetes mellitus: Secondary | ICD-10-CM | POA: Diagnosis not present

## 2018-07-03 DIAGNOSIS — Z0000 Encounter for general adult medical examination without abnormal findings: Secondary | ICD-10-CM

## 2018-07-03 DIAGNOSIS — Z13220 Encounter for screening for lipoid disorders: Secondary | ICD-10-CM | POA: Diagnosis not present

## 2018-07-03 DIAGNOSIS — Z125 Encounter for screening for malignant neoplasm of prostate: Secondary | ICD-10-CM

## 2018-07-03 DIAGNOSIS — H6123 Impacted cerumen, bilateral: Secondary | ICD-10-CM

## 2018-07-03 NOTE — Progress Notes (Signed)
Subjective:  Patient accompanied by his son.   [name] is a 53 y.o. male and is here for a comprehensive physical exam. The patient reports no problems.  Patient states he has been doing well since his hip replacement.  States no longer walking with a limp.  Pt his daughter has been encouraging him to take better care of himself.  History of CVA: -Not on medications as cholesterol and blood pressure have been controlled in the past.  Urinary concerns: -Pt endorses urinary hesitancy, decreased stream, incomplete bladder emptying "times a while".  Social History   Socioeconomic History  . Marital status: Married    Spouse name: Not on file  . Number of children: Not on file  . Years of education: Not on file  . Highest education level: Not on file  Occupational History  . Not on file  Social Needs  . Financial resource strain: Not on file  . Food insecurity:    Worry: Not on file    Inability: Not on file  . Transportation needs:    Medical: Not on file    Non-medical: Not on file  Tobacco Use  . Smoking status: Never Smoker  . Smokeless tobacco: Never Used  Substance and Sexual Activity  . Alcohol use: Yes    Comment: daily  . Drug use: Not Currently  . Sexual activity: Not on file  Lifestyle  . Physical activity:    Days per week: Not on file    Minutes per session: Not on file  . Stress: Not on file  Relationships  . Social connections:    Talks on phone: Not on file    Gets together: Not on file    Attends religious service: Not on file    Active member of club or organization: Not on file    Attends meetings of clubs or organizations: Not on file    Relationship status: Not on file  . Intimate partner violence:    Fear of current or ex partner: Not on file    Emotionally abused: Not on file    Physically abused: Not on file    Forced sexual activity: Not on file  Other Topics Concern  . Not on file  Social History Narrative  . Not on file    Health Maintenance  Topic Date Due  . TETANUS/TDAP  11/28/1983  . COLONOSCOPY  11/28/2014  . INFLUENZA VACCINE  01/22/2018  . HIV Screening  Completed    The following portions of the patient's history were reviewed and updated as appropriate: allergies, current medications, past family history, past medical history, past social history, past surgical history and problem list.  Review of Systems Pertinent items noted in HPI and remainder of comprehensive ROS otherwise negative.   Objective:    BP 102/78 (BP Location: Right Arm, Patient Position: Sitting, Cuff Size: Normal)   Pulse 64   Temp 98.6 F (37 C) (Oral)   Wt 156 lb (70.8 kg)   SpO2 98%   BMI 18.99 kg/m  General appearance: alert, cooperative and no distress Head: Normocephalic, without obvious abnormality, atraumatic Eyes: conjunctivae/corneas clear. PERRL, EOM's intact. Fundi benign. Ears: Bilateral canals occluded with wax.  normal TM's and external ear canals both ears after irrigation. Nose: Nares normal. Septum midline. Mucosa normal. No drainage or sinus tenderness. Throat: lips, mucosa, and tongue normal; teeth and gums normal Neck: no adenopathy, no carotid bruit, no JVD, supple, symmetrical, trachea midline and thyroid not enlarged, symmetric, no tenderness/mass/nodules  Lungs: clear to auscultation bilaterally Heart: regular rate and rhythm, S1, S2 normal, no murmur, click, rub or gallop Abdomen: soft, non-tender; bowel sounds normal; no masses,  no organomegaly Extremities: extremities normal, atraumatic, no cyanosis or edema Skin: Skin color, texture, turgor normal. No rashes or lesions Neurologic: Alert and oriented X 3, normal strength and tone. Normal symmetric reflexes. Normal coordination and gait    Assessment:    Healthy male exam.      Plan:     Anticipatory guidance given including wearing seatbelts, smoke detectors in the home, increasing physical activity, increasing p.o. intake of water  and vegetables. -will obtain labs (CBC, BMP, lipids, Hgb A1C, PSA) -referral placed for colonoscopy -given handout -Next CPE in 1 yr See After Visit Summary for Counseling Recommendations    Cerumen impaction -Consent obtained.  Bilateral ears irrigated.  Patient tolerated procedure well -Given handout  BPH -Having urinary hesitancy, decreased stream, incomplete bladder emptying -Will obtain PSA -If labs normal discussed trial of Flomax -Given handout  Need for influenza vaccine -Given this visit  Follow-up PRN  Grier Mitts, MD

## 2018-07-03 NOTE — Patient Instructions (Addendum)
Preventive Care 40-64 Years, Male Preventive care refers to lifestyle choices and visits with your health care provider that can promote health and wellness. What does preventive care include?   A yearly physical exam. This is also called an annual well check.  Dental exams once or twice a year.  Routine eye exams. Ask your health care provider how often you should have your eyes checked.  Personal lifestyle choices, including: ? Daily care of your teeth and gums. ? Regular physical activity. ? Eating a healthy diet. ? Avoiding tobacco and drug use. ? Limiting alcohol use. ? Practicing safe sex. ? Taking low-dose aspirin every day starting at age 50. What happens during an annual well check? The services and screenings done by your health care provider during your annual well check will depend on your age, overall health, lifestyle risk factors, and family history of disease. Counseling Your health care provider may ask you questions about your:  Alcohol use.  Tobacco use.  Drug use.  Emotional well-being.  Home and relationship well-being.  Sexual activity.  Eating habits.  Work and work environment. Screening You may have the following tests or measurements:  Height, weight, and BMI.  Blood pressure.  Lipid and cholesterol levels. These may be checked every 5 years, or more frequently if you are over 50 years old.  Skin check.  Lung cancer screening. You may have this screening every year starting at age 55 if you have a 30-pack-year history of smoking and currently smoke or have quit within the past 15 years.  Colorectal cancer screening. All adults should have this screening starting at age 50 and continuing until age 75. Your health care provider may recommend screening at age 45. You will have tests every 1-10 years, depending on your results and the type of screening test. People at increased risk should start screening at an earlier age. Screening tests may  include: ? Guaiac-based fecal occult blood testing. ? Fecal immunochemical test (FIT). ? Stool DNA test. ? Virtual colonoscopy. ? Sigmoidoscopy. During this test, a flexible tube with a tiny camera (sigmoidoscope) is used to examine your rectum and lower colon. The sigmoidoscope is inserted through your anus into your rectum and lower colon. ? Colonoscopy. During this test, a long, thin, flexible tube with a tiny camera (colonoscope) is used to examine your entire colon and rectum.  Prostate cancer screening. Recommendations will vary depending on your family history and other risks.  Hepatitis C blood test.  Hepatitis B blood test.  Sexually transmitted disease (STD) testing.  Diabetes screening. This is done by checking your blood sugar (glucose) after you have not eaten for a while (fasting). You may have this done every 1-3 years. Discuss your test results, treatment options, and if necessary, the need for more tests with your health care provider. Vaccines Your health care provider may recommend certain vaccines, such as:  Influenza vaccine. This is recommended every year.  Tetanus, diphtheria, and acellular pertussis (Tdap, Td) vaccine. You may need a Td booster every 10 years.  Varicella vaccine. You may need this if you have not been vaccinated.  Zoster vaccine. You may need this after age 60.  Measles, mumps, and rubella (MMR) vaccine. You may need at least one dose of MMR if you were born in 1957 or later. You may also need a second dose.  Pneumococcal 13-valent conjugate (PCV13) vaccine. You may need this if you have certain conditions and have not been vaccinated.  Pneumococcal polysaccharide (PPSV23) vaccine.   You may need one or two doses if you smoke cigarettes or if you have certain conditions.  Meningococcal vaccine. You may need this if you have certain conditions.  Hepatitis A vaccine. You may need this if you have certain conditions or if you travel or work in  places where you may be exposed to hepatitis A.  Hepatitis B vaccine. You may need this if you have certain conditions or if you travel or work in places where you may be exposed to hepatitis B.  Haemophilus influenzae type b (Hib) vaccine. You may need this if you have certain risk factors. Talk to your health care provider about which screenings and vaccines you need and how often you need them. This information is not intended to replace advice given to you by your health care provider. Make sure you discuss any questions you have with your health care provider. Document Released: 07/07/2015 Document Revised: 07/31/2017 Document Reviewed: 04/11/2015 Elsevier Interactive Patient Education  2019 [place], Adult The ears produce a substance called earwax that helps keep bacteria out of the ear and protects the skin in the ear canal. Occasionally, earwax can build up in the ear and cause discomfort or hearing loss. What increases the risk? This condition is more likely to develop in people who:  Are male.  Are elderly.  Naturally produce more earwax.  Clean their ears often with cotton swabs.  Use earplugs often.  Use in-ear headphones often.  Wear hearing aids.  Have narrow ear canals.  Have earwax that is overly thick or sticky.  Have eczema.  Are dehydrated.  Have excess hair in the ear canal. What are the signs or symptoms? Symptoms of this condition include:  Reduced or muffled hearing.  A feeling of fullness in the ear or feeling that the ear is plugged.  Fluid coming from the ear.  Ear pain.  Ear itch.  Ringing in the ear.  Coughing.  An obvious piece of earwax that can be seen inside the ear canal. How is this diagnosed? This condition may be diagnosed based on:  Your symptoms.  Your medical history.  An ear exam. During the exam, your health care provider will look into your ear with an instrument called an otoscope. You  may have tests, including a hearing test. How is this treated? This condition may be treated by:  Using ear drops to soften the earwax.  Having the earwax removed by a health care provider. The health care provider may: ? Flush the ear with water. ? Use an instrument that has a loop on the end (curette). ? Use a suction device.  Surgery to remove the wax buildup. This may be done in severe cases. Follow these instructions at home:   Take over-the-counter and prescription medicines only as told by your health care provider.  Do not put any objects, including cotton swabs, into your ear. You can clean the opening of your ear canal with a washcloth or facial tissue.  Follow instructions from your health care provider about cleaning your ears. Do not over-clean your ears.  Drink enough fluid to keep your urine clear or pale yellow. This will help to thin the earwax.  Keep all follow-up visits as told by your health care provider. If earwax builds up in your ears often or if you use hearing aids, consider seeing your health care provider for routine, preventive ear cleanings. Ask your health care provider how often you should schedule your cleanings.  If you have hearing aids, clean them according to instructions from the manufacturer and your health care provider. Contact a health care provider if:  You have ear pain.  You develop a fever.  You have blood, pus, or other fluid coming from your ear.  You have hearing loss.  You have ringing in your ears that does not go away.  Your symptoms do not improve with treatment.  You feel like the room is spinning (vertigo). Summary  Earwax can build up in the ear and cause discomfort or hearing loss.  The most common symptoms of this condition include reduced or muffled hearing and a feeling of fullness in the ear or feeling that the ear is plugged.  This condition may be diagnosed based on your symptoms, your medical history, and  an ear exam.  This condition may be treated by using ear drops to soften the earwax or by having the earwax removed by a health care provider.  Do not put any objects, including cotton swabs, into your ear. You can clean the opening of your ear canal with a washcloth or facial tissue. This information is not intended to replace advice given to you by your health care provider. Make sure you discuss any questions you have with your health care provider. Document Released: 07/18/2004 Document Revised: 05/22/2017 Document Reviewed: 08/21/2016 Elsevier Interactive Patient Education  2019 [employer] Inc[employer]  Benign Prostatic Hyperplasia  Benign prostatic hyperplasia (BPH) is an enlarged prostate gland that is caused by the normal aging process and not by cancer. The prostate is a walnut-sized gland that is involved in the production of semen. It is located in front of the rectum and below the bladder. The bladder stores urine and the urethra is the tube that carries the urine out of the body. The prostate may get bigger as a man gets older. An enlarged prostate can press on the urethra. This can make it harder to pass urine. The build-up of urine in the bladder can cause infection. Back pressure and infection may progress to bladder damage and kidney (renal) failure. What are the causes? This condition is part of a normal aging process. However, not all men develop problems from this condition. If the prostate enlarges away from the urethra, urine flow will not be blocked. If it enlarges toward the urethra and compresses it, there will be problems passing urine. What increases the risk? This condition is more likely to develop in men over the age of 39 years. What are the signs or symptoms? Symptoms of this condition include:  Getting up often during the night to urinate.  Needing to urinate frequently during the day.  Difficulty starting urine flow.  Decrease in size and strength of your urine  stream.  Leaking (dribbling) after urinating.  Inability to pass urine. This needs immediate treatment.  Inability to completely empty your bladder.  Pain when you pass urine. This is more common if there is also an infection.  Urinary tract infection (UTI). How is this diagnosed? This condition is diagnosed based on your medical history, a physical exam, and your symptoms. Tests will also be done, such as:  A post-void bladder scan. This measures any amount of urine that may remain in your bladder after you finish urinating.  A digital rectal exam. In a rectal exam, your health care provider checks your prostate by putting a lubricated, gloved finger into your rectum to feel the back of your prostate gland. This exam detects the size of  your gland and any abnormal lumps or growths.  An exam of your urine (urinalysis).  A prostate specific antigen (PSA) screening. This is a blood test used to screen for prostate cancer.  An ultrasound. This test uses sound waves to electronically produce a picture of your prostate gland. Your health care provider may refer you to a specialist in kidney and prostate diseases (urologist). How is this treated? Once symptoms begin, your health care provider will monitor your condition (active surveillance or watchful waiting). Treatment for this condition will depend on the severity of your condition. Treatment may include:  Observation and yearly exams. This may be the only treatment needed if your condition and symptoms are mild.  Medicines to relieve your symptoms, including: ? Medicines to shrink the prostate. ? Medicines to relax the muscle of the prostate.  Surgery in severe cases. Surgery may include: ? Prostatectomy. In this procedure, the prostate tissue is removed completely through an open incision or with a laparascope or robotics. ? Transurethral resection of the prostate (TURP). In this procedure, a tool is inserted through the opening at  the tip of the penis (urethra). It is used to cut away tissue of the inner core of the prostate. The pieces are removed through the same opening of the penis. This removes the blockage. ? Transurethral incision (TUIP). In this procedure, small cuts are made in the prostate. This lessens the prostate's pressure on the urethra. ? Transurethral microwave thermotherapy (TUMT). This procedure uses microwaves to create heat. The heat destroys and removes a small amount of prostate tissue. ? Transurethral needle ablation (TUNA). This procedure uses radio frequencies to destroy and remove a small amount of prostate tissue. ? Interstitial laser coagulation ([place]). This procedure uses a laser to destroy and remove a small amount of prostate tissue. ? Transurethral electrovaporization (TUVP). This procedure uses electrodes to destroy and remove a small amount of prostate tissue. ? Prostatic urethral lift. This procedure inserts an implant to push the lobes of the prostate away from the urethra. Follow these instructions at home:  Take over-the-counter and prescription medicines only as told by your health care provider.  Monitor your symptoms for any changes. Contact your health care provider with any changes.  Avoid drinking large amounts of liquid before going to bed or out in public.  Avoid or reduce how much caffeine or alcohol you drink.  Give yourself time when you urinate.  Keep all follow-up visits as told by your health care provider. This is important. Contact a health care provider if:  You have unexplained back pain.  Your symptoms do not get better with treatment.  You develop side effects from the medicine you are taking.  Your urine becomes very dark or has a bad smell.  Your lower abdomen becomes distended and you have trouble passing your urine. Get help right away if:  You have a fever or chills.  You suddenly cannot urinate.  You feel lightheaded, or very dizzy, or you  faint.  There are large amounts of blood or clots in the urine.  Your urinary problems become hard to manage.  You develop moderate to severe low back or flank pain. The flank is the side of your body between the ribs and the hip. These symptoms may represent a serious problem that is an emergency. Do not wait to see if the symptoms will go away. Get medical help right away. Call your local emergency services (911 in the [place] Do not drive yourself to  the hospital. Summary  Benign prostatic hyperplasia (BPH) is an enlarged prostate that is caused by the normal aging process and not by cancer.  An enlarged prostate can press on the urethra. This can make it hard to pass urine.  This condition is part of a normal aging process and is more likely to develop in men over the age of 45 years.  Get help right away if you suddenly cannot urinate. This information is not intended to replace advice given to you by your health care provider. Make sure you discuss any questions you have with your health care provider. Document Released: 06/10/2005 Document Revised: 07/15/2016 Document Reviewed: 07/15/2016 Elsevier Interactive Patient Education  2019 [employer].

## 2018-07-04 LAB — BASIC METABOLIC PANEL
BUN: 15 mg/dL (ref 7–25)
CHLORIDE: 103 mmol/L (ref 98–110)
CO2: 28 mmol/L (ref 20–32)
CREATININE: 0.97 mg/dL (ref 0.70–1.33)
Calcium: 9.3 mg/dL (ref 8.6–10.3)
Glucose, Bld: 71 mg/dL (ref 65–99)
POTASSIUM: 4 mmol/L (ref 3.5–5.3)
Sodium: 139 mmol/L (ref 135–146)

## 2018-07-04 LAB — CBC WITH DIFFERENTIAL/PLATELET
ABSOLUTE MONOCYTES: 222 {cells}/uL (ref 200–950)
BASOS PCT: 0.5 %
Basophils Absolute: 19 cells/uL (ref 0–200)
EOS ABS: 59 {cells}/uL (ref 15–500)
Eosinophils Relative: 1.6 %
HCT: 45.6 % (ref 38.5–50.0)
Hemoglobin: 15.8 g/dL (ref 13.2–17.1)
Lymphs Abs: 1621 cells/uL (ref 850–3900)
MCH: 31.3 pg (ref 27.0–33.0)
MCHC: 34.6 g/dL (ref 32.0–36.0)
MCV: 90.5 fL (ref 80.0–100.0)
MPV: 9.5 fL (ref 7.5–12.5)
Monocytes Relative: 6 %
Neutro Abs: 1780 cells/uL (ref 1500–7800)
Neutrophils Relative %: 48.1 %
Platelets: 140 10*3/uL (ref 140–400)
RBC: 5.04 10*6/uL (ref 4.20–5.80)
RDW: 13 % (ref 11.0–15.0)
TOTAL LYMPHOCYTE: 43.8 %
WBC: 3.7 10*3/uL — ABNORMAL LOW (ref 3.8–10.8)

## 2018-07-04 LAB — PSA: PSA: 1.4 ng/mL (ref <=4.0)

## 2018-07-04 LAB — HEMOGLOBIN A1C
Hgb A1c MFr Bld: 5 % of total Hgb (ref <5.7)
Mean Plasma Glucose: 97 (calc)
eAG (mmol/L): 5.4 (calc)

## 2018-07-04 LAB — LIPID PANEL
CHOL/HDL RATIO: 2.3 (calc) (ref <5.0)
CHOLESTEROL: 189 mg/dL (ref <200)
HDL: 82 mg/dL (ref >40)
LDL CHOLESTEROL (CALC): 93 mg/dL
Non-HDL Cholesterol (Calc): 107 mg/dL (calc) (ref <130)
Triglycerides: 47 mg/dL (ref <150)

## 2018-09-15 ENCOUNTER — Encounter: Payer: Self-pay | Admitting: Family Medicine

## 2018-11-19 ENCOUNTER — Ambulatory Visit (INDEPENDENT_AMBULATORY_CARE_PROVIDER_SITE_OTHER): Payer: Managed Care, Other (non HMO) | Admitting: Family Medicine

## 2018-11-19 ENCOUNTER — Encounter: Payer: Self-pay | Admitting: Family Medicine

## 2018-11-19 ENCOUNTER — Ambulatory Visit: Payer: Self-pay | Admitting: *Deleted

## 2018-11-19 ENCOUNTER — Other Ambulatory Visit: Payer: Self-pay

## 2018-11-19 DIAGNOSIS — R202 Paresthesia of skin: Secondary | ICD-10-CM | POA: Diagnosis not present

## 2018-11-19 DIAGNOSIS — R002 Palpitations: Secondary | ICD-10-CM

## 2018-11-19 DIAGNOSIS — Z7289 Other problems related to lifestyle: Secondary | ICD-10-CM | POA: Diagnosis not present

## 2018-11-19 DIAGNOSIS — Z789 Other specified health status: Secondary | ICD-10-CM

## 2018-11-19 NOTE — Telephone Encounter (Signed)
Pt scheduled with PCP

## 2018-11-19 NOTE — Telephone Encounter (Signed)
Pt reports "Tingling" left arm from elbow to wrist. States intermittent, occurs daily, duration of less than 1 minute. Denies any pain, swelling, warmth. States works in [employer] where he does a lot of repetitive movements.  Call transferred to practice, Tammy, for consideration of appt. Call dropped. Tammy stated would CB pt.  Pts email and ph # verified.   Reason for Disposition . [1] Tingling in hand (e.g., pins and needles) AND [2] after prolonged laying on arm AND [3]  brief (now gone)  Answer Assessment - Initial Assessment Questions 1. SYMPTOM: "What is the main symptom you are concerned about?" (e.g., weakness, numbness)     Tingling left arm from elbow to wrist 2. ONSET: "When did this start?" (minutes, hours, days; while sleeping)    Over a week ago 3. LAST NORMAL: "When was the last time you were normal (no symptoms)?"     Week ago 4. PATTERN "Does this come and go, or has it been constant since it started?"  "Is it present now?"     Intermittent, occurs daily, duration less than a minute 5. CARDIAC SYMPTOMS: "Have you had any of the following symptoms: chest pain, difficulty breathing, palpitations?"     Warehouse worker, repetitive movements 6. NEUROLOGIC SYMPTOMS: "Have you had any of the following symptoms: headache, dizziness, vision loss, double vision, changes in speech, unsteady on your feet?"     no 7. OTHER SYMPTOMS: "Do you have any other symptoms?"     no  Protocols used: NEUROLOGIC DEFICIT-A-AH

## 2018-11-19 NOTE — Patient Instructions (Signed)
Palpitations  Palpitations are feelings that your heartbeat is irregular or is faster than normal. It may feel like your heart is fluttering or skipping a beat. Palpitations are usually not a serious problem. They may be caused by many things, including smoking, caffeine, alcohol, stress, and certain medicines or drugs. Most causes of palpitations are not serious. However, some palpitations can be a sign of a serious problem. You may need further tests to rule out serious medical problems.  Follow these instructions at home:         Pay attention to any changes in your condition. Take these actions to help manage your symptoms:  Eating and drinking  Avoid foods and drinks that may cause palpitations. These may include:  Caffeinated coffee, tea, soft drinks, diet pills, and energy drinks.  Chocolate.  Alcohol.  Lifestyle  Take steps to reduce your stress and anxiety. Things that can help you relax include:  Yoga.  Mind-body activities, such as deep breathing, meditation, or using words and images to create positive thoughts (guided imagery).  Physical activity, such as swimming, jogging, or walking. Tell your health care provider if your palpitations increase with activity. If you have chest pain or shortness of breath with activity, do not continue the activity until you are seen by your health care provider.  Biofeedback. This is a method that helps you learn to use your mind to control things in your body, such as your heartbeat.  Do not use drugs, including cocaine or ecstasy. Do not use marijuana.  Get plenty of rest and sleep. Keep a regular bed time.  General instructions  Take over-the-counter and prescription medicines only as told by your health care provider.  Do not use any products that contain nicotine or tobacco, such as cigarettes and e-cigarettes. If you need help quitting, ask your health care provider.  Keep all follow-up visits as told by your health care provider. This is important. These may  include visits for further testing if palpitations do not go away or get worse.  Contact a health care provider if you:  Continue to have a fast or irregular heartbeat after 24 hours.  Notice that your palpitations occur more often.  Get help right away if you:  Have chest pain or shortness of breath.  Have a severe headache.  Feel dizzy or you faint.  Summary  Palpitations are feelings that your heartbeat is irregular or is faster than normal. It may feel like your heart is fluttering or skipping a beat.  Palpitations may be caused by many things, including smoking, caffeine, alcohol, stress, certain medicines, and drugs.  Although most causes of palpitations are not serious, some causes can be a sign of a serious medical problem.  Get help right away if you faint or have chest pain, shortness of breath, a severe headache, or dizziness.  This information is not intended to replace advice given to you by your health care provider. Make sure you discuss any questions you have with your health care provider.  Document Released: 06/07/2000 Document Revised: 07/23/2017 Document Reviewed: 07/23/2017  Elsevier Interactive Patient Education  2019 Elsevier Inc.

## 2018-11-19 NOTE — Progress Notes (Signed)
Virtual Visit via Video Note Visit started on doxy, however pt's audio went out.  Pt was subsequently called via phone to complete the visit.  I connected with [name]. Bultema on 11/19/18 at  4:30 PM EDT by a video enabled telemedicine application and verified that I am speaking with the correct person using two identifiers.  Location patient: home Location provider:work or home office Persons participating in the virtual visit: patient, provider  I discussed the limitations of evaluation and management by telemedicine and the availability of in person appointments. The patient expressed understanding and agreed to proceed.   HPI: Pt is a 53 yo male with pmh sig for CVA, OA of R hip s/p THR, h/o HLD.  Pt with tingling in L forearm x 1 wk.  Sometimes happens in the R arm.  Sensation starts at elbow then goes down forearm.  Pt does repetitive motions at work picking orders.  States has tingling "from time to time in chest" near my heart "like my heart skips a beat".  May last 5-10 secs.  Happens while at work or at the house, not doing anything strenuous.  Drinking 1-2 cups of tea per day.  Does not drink sodas.  Pt drinking 3-4 bud lights and a shot of tequila per night.  Also loves chocolate.  Denies chest pressure, CP, heart burn, N/V, HAs, dizziness, SOB, changes in vision, or tingling in neck or face.   ROS: See pertinent positives and negatives per HPI.  Past Medical History:  Diagnosis Date  . High cholesterol   . Stroke [facility])     Past Surgical History:  Procedure Laterality Date  . TOTAL HIP ARTHROPLASTY Right 12/05/2017   Procedure: TOTAL HIP ARTHROPLASTY;  Surgeon: Earlie Server, MD;  Location: [facility];  Service: Orthopedics;  Laterality: Right;    No family history on file.  SOCIAL HX:    Current Outpatient Medications:  .  aspirin 325 MG EC tablet, Take 325 mg by mouth daily., Disp: , Rfl:  .  aspirin EC 325 MG tablet, 1 tab a day for the next 30 days to prevent blood  clots, Disp: 30 tablet, Rfl: 0 .  docusate sodium (COLACE) 100 MG capsule, 1 tab 2 times a day while on narcotics.  STOOL SOFTENER, Disp: 60 capsule, Rfl: 0 .  gabapentin (NEURONTIN) 300 MG capsule, 1 po qhs for nerve pain, Disp: 30 capsule, Rfl: 0 .  polyethylene glycol (MIRALAX) packet, 17 grams in 6 oz of favorite drink twice a day until bowel movement.  LAXITIVE.  Restart if two days since last bowel movement, Disp: 14 packet, Rfl: 0  EXAM:  VITALS per patient if applicable:  GENERAL: alert, oriented, appears well and in no acute distress  HEENT: atraumatic, conjunctiva clear, no obvious abnormalities on inspection of external nose and ears  NECK: normal movements of the head and neck  LUNGS: on inspection no signs of respiratory distress, breathing rate appears normal, no obvious gross SOB, gasping or wheezing  CV: no obvious cyanosis  MS: no TTP or reproduction of symptoms with pt tapping on epicondyles.  moves all visible extremities without noticeable abnormality  PSYCH/NEURO: pleasant and cooperative, no obvious depression or anxiety, speech and thought processing grossly intact  ASSESSMENT AND PLAN:  Discussed the following assessment and plan:  Palpitations  -discussed various causes -pt advised to cut down on consumption of caffeine, chocolate, and EtOH -will obtain EKG -if needed will place referral to Cardiology - Plan: Comprehensive metabolic panel, TSH,  CBC (no diff) -given precautions  Paresthesias  -discussed causes including vitamin deficiency, nerve entrapment 2/2 repetitive motion - Plan: Vitamin B12, folate  Alcohol Use -discussed cutting down - Plan: CMP, Vitamin B12, folate  F/u prn   I discussed the assessment and treatment plan with the patient. The patient was provided an opportunity to ask questions and all were answered. The patient agreed with the plan and demonstrated an understanding of the instructions.   The patient was advised to call  back or seek an in-person evaluation if the symptoms worsen or if the condition fails to improve as anticipated.  I provided 12 minutes of non-face-to-face time during this encounter.   [name], MD

## 2019-09-28 ENCOUNTER — Encounter: Payer: Self-pay | Admitting: Family Medicine

## 2019-09-28 ENCOUNTER — Telehealth (INDEPENDENT_AMBULATORY_CARE_PROVIDER_SITE_OTHER): Payer: Managed Care, Other (non HMO) | Admitting: Family Medicine

## 2019-09-28 ENCOUNTER — Encounter: Payer: Self-pay | Admitting: Gastroenterology

## 2019-09-28 VITALS — Wt 160.0 lb

## 2019-09-28 DIAGNOSIS — M25552 Pain in left hip: Secondary | ICD-10-CM

## 2019-09-28 DIAGNOSIS — Z1211 Encounter for screening for malignant neoplasm of colon: Secondary | ICD-10-CM | POA: Diagnosis not present

## 2019-09-28 NOTE — Progress Notes (Signed)
Virtual Visit via Video Note  I connected with [name] on 09/28/19 at  1:30 PM EDT by a video enabled telemedicine application 2/2 [id number] pandemic and verified that I am speaking with the correct person using two identifiers.  Location patient: home Location provider:work or home office Persons participating in the virtual visit: patient, provider  I discussed the limitations of evaluation and management by telemedicine and the availability of in person appointments. The patient expressed understanding and agreed to proceed.   HPI: Pt is a 54 yo male with pmh sig for h/o CVA, OA R hip s/p THR, HLD seen for ongoing concern.  Pt had R THR 2/2 OA in 2019.  Now with similar pain in L hip.  Pain gradually increasing.  Had to use a cane this wk.  Pain occurs when up moving, but now starting when laying down.  Pain 8.5/10, sharp, stabbing when puts pressure on leg.  Also with decreased ROM in L hip.  Has not taken anything for the pain.   States his job is noticing that he is in pain.  ROS: See pertinent positives and negatives per HPI.  Past Medical History:  Diagnosis Date  . High cholesterol   . Stroke [facility])     Past Surgical History:  Procedure Laterality Date  . TOTAL HIP ARTHROPLASTY Right 12/05/2017   Procedure: TOTAL HIP ARTHROPLASTY;  Surgeon: Earlie Server, MD;  Location: [place];  Service: Orthopedics;  Laterality: Right;    History reviewed. No pertinent family history.   Current Outpatient Medications:  .  aspirin 325 MG EC tablet, Take 325 mg by mouth daily., Disp: , Rfl:   EXAM:  VITALS per patient if applicable: RR between 123456 bpm   GENERAL: alert, oriented, appears well and in no acute distress  HEENT: atraumatic, conjunctiva clear, no obvious abnormalities on inspection of external nose and ears  NECK: normal movements of the head and neck  LUNGS: on inspection no signs of respiratory distress, breathing rate appears normal, no obvious gross SOB, gasping  or wheezing  CV: no obvious cyanosis  MS: moves all visible extremities without noticeable abnormality  PSYCH/NEURO: pleasant and cooperative, no obvious depression or anxiety, speech and thought processing grossly intact  ASSESSMENT AND PLAN:  Discussed the following assessment and plan:  Left hip pain  -supportive care: OTC Voltaren gel and tylenol arthritis strength -discussed need for imaging. -pt encouraged to contact Ortho for further eval.  Previously seen by Dr. French Ana for R THR 2/2 OA. - Plan: Ambulatory referral to Orthopedic Surgery  Screen for colon cancer  - Plan: Ambulatory referral to Gastroenterology  F/u prn  I discussed the assessment and treatment plan with the patient. The patient was provided an opportunity to ask questions and all were answered. The patient agreed with the plan and demonstrated an understanding of the instructions.   The patient was advised to call back or seek an in-person evaluation if the symptoms worsen or if the condition fails to improve as anticipated.  I provided 12:46 minutes of non-face-to-face time during this encounter.   [name], MD

## 2019-09-29 ENCOUNTER — Encounter: Payer: Self-pay | Admitting: Family Medicine

## 2019-10-11 ENCOUNTER — Telehealth: Payer: Self-pay

## 2019-10-11 NOTE — Telephone Encounter (Signed)
Pt NS for PV.  Attempted to call but unable to leave message-mail box not set up.

## 2019-10-11 NOTE — Telephone Encounter (Signed)
NS letter sent for pt to reschedule at his convenience.

## 2019-10-25 ENCOUNTER — Encounter: Payer: Managed Care, Other (non HMO) | Admitting: Gastroenterology

## 2019-11-15 ENCOUNTER — Encounter: Payer: Managed Care, Other (non HMO) | Admitting: Family Medicine

## 2019-11-19 ENCOUNTER — Encounter: Payer: Self-pay | Admitting: Family Medicine

## 2019-11-19 DIAGNOSIS — Z0289 Encounter for other administrative examinations: Secondary | ICD-10-CM

## 2020-03-30 ENCOUNTER — Encounter: Payer: Self-pay | Admitting: Family Medicine

## 2020-12-15 ENCOUNTER — Ambulatory Visit (INDEPENDENT_AMBULATORY_CARE_PROVIDER_SITE_OTHER): Payer: Self-pay

## 2020-12-15 ENCOUNTER — Ambulatory Visit (HOSPITAL_COMMUNITY)
Admission: RE | Admit: 2020-12-15 | Discharge: 2020-12-15 | Disposition: A | Discharge status: Home / Self Care | Payer: Self-pay | Source: Ambulatory Visit | Attending: Emergency Medicine | Admitting: Emergency Medicine

## 2020-12-15 ENCOUNTER — Encounter (HOSPITAL_COMMUNITY): Payer: Self-pay

## 2020-12-15 ENCOUNTER — Other Ambulatory Visit: Payer: Self-pay

## 2020-12-15 VITALS — BP 118/60 | HR 73 | Temp 99.2°F | Resp 18

## 2020-12-15 DIAGNOSIS — M25571 Pain in right ankle and joints of right foot: Secondary | ICD-10-CM

## 2020-12-15 DIAGNOSIS — R739 Hyperglycemia, unspecified: Secondary | ICD-10-CM

## 2020-12-15 DIAGNOSIS — M25471 Effusion, right ankle: Secondary | ICD-10-CM

## 2020-12-15 LAB — CBG MONITORING, ED: Glucose-Capillary: 190 mg/dL — ABNORMAL HIGH (ref 70–99)

## 2020-12-15 MED ORDER — NAPROXEN 500 MG PO TABS: 500.0000 mg | ORAL_TABLET | Freq: Two times a day (BID) | ORAL | 0 refills | Status: DC

## 2020-12-15 NOTE — ED Triage Notes (Signed)
Pt presents with right ankle swelling x 2 weeks. He denies pain and any injuries.

## 2020-12-15 NOTE — Discharge Instructions (Addendum)
Take the naproxen twice a day as needed to help with swelling.    You can swear a compression sock, especially when standing or walking for long periods of time to help reduce swelling.   Elevated your foot/ankle above your hip when sitting or laying down.    For diabetes or elevated blood sugar, please make sure you are limiting and avoiding starchy, carbohydrate foods like pasta, breads, sweet breads, pastry, rice, potatoes, desserts. These foods can elevate your blood sugar. Also, limit and avoid drinks that contain a lot of sugar such as sodas, sweet teas, fruit juices.  Drinking plain water will be much more helpful, try 64 ounces of water daily.  It is okay to flavor your water naturally by cutting cucumber, lemon, mint or lime, placing it in a picture with water and drinking it over a period of 24-48 hours as long as it remains refrigerated.  For elevated blood pressure, make sure you are monitoring salt in your diet.  Do not eat restaurant foods and limit processed foods at home. I highly recommend you prepare and cook your own foods at home.  Processed foods include things like frozen meals, pre-seasoned meats and dinners, deli meats, canned foods as these foods contain a high amount of sodium/salt.  Make sure you are paying attention to sodium labels on foods you buy at the grocery store. Buy your spices separately such as garlic powder, onion powder, cumin, cayenne, parsley flakes so that you can avoid seasonings that contain salt. However, salt-free seasonings are available and can be used, an example is Mrs. Dash and includes a lot of different mixtures that do not contain salt.  Lastly, when cooking using oils that are healthier for you is important. This includes olive oil, avocado oil, canola oil. We have discussed a lot of foods to avoid but below is a list of foods that can be very healthy to use in your diet whether it is for diabetes, cholesterol, high blood pressure, or in general  healthy eating.  Salads - kale, spinach, cabbage, spring mix, arugula Fruits - avocadoes, berries (blueberries, raspberries, blackberries), apples, oranges, pomegranate, grapefruit, kiwi Vegetables - asparagus, cauliflower, broccoli, green beans, brussel sprouts, bell peppers, beets; stay away from or limit starchy vegetables like potatoes, carrots, peas Other general foods - kidney beans, egg whites, almonds, walnuts, sunflower seeds, pumpkin seeds, fat free yogurt, almond milk, flax seeds, quinoa, oats  Meat - It is better to eat lean meats and limit your red meat including pork to once a week.  Wild caught fish, chicken breast are good options as they tend to be leaner sources of good protein. Still be mindful of the sodium labels for the meats you buy.  DO NOT EAT ANY FOODS ON THIS LIST THAT YOU ARE ALLERGIC TO. For more specific needs, I highly recommend consulting a dietician or nutritionist but this can definitely be a good starting point.  Follow up with primary care for re-evaluation as soon as you are able.  Return or go to the Emergency Department if symptoms worsen or do not improve in the next few days.

## 2020-12-15 NOTE — ED Provider Notes (Signed)
[facility]    CSN: [id number] Arrival date & time: 12/15/20  0845      History   Chief Complaint Chief Complaint  Patient presents with   Appointment    0900   Joint Swelling    RT ankle    HPI [name] is a 55 y.o. male.   Patient here for evaluation of right ankle swelling and tenderness for the past 2-3 weeks.  Denies specific pain but states it feels "tight".  Denies any trauma, injury, or other precipitating event.  Denies any specific alleviating or aggravating factors.  Reports concern for possible underlying condition, such as diabetes.  Denies any leg or calf pain or numbness, denies any history of similar symptoms or CHF.  Denies any fevers, chest pain, shortness of breath, N/V/D, numbness, tingling, weakness, abdominal pain, or headaches.    The history is provided by the patient.   Past Medical History:  Diagnosis Date   High cholesterol    Stroke [facility])     Patient Active Problem List   Diagnosis Date Noted   Primary localized osteoarthritis of right hip 12/05/2017   History of total hip arthroplasty, right 12/05/2017    Past Surgical History:  Procedure Laterality Date   TOTAL HIP ARTHROPLASTY Right 12/05/2017   Procedure: TOTAL HIP ARTHROPLASTY;  Surgeon: Earlie Server, MD;  Location: [place];  Service: Orthopedics;  Laterality: Right;       Home Medications    Prior to Admission medications   Medication Sig Start Date End Date Taking? Authorizing Provider  naproxen (NAPROSYN) 500 MG tablet Take 1 tablet (500 mg total) by mouth 2 (two) times daily. 12/15/20  Yes Pearson Forster, NP  aspirin 325 MG EC tablet Take 325 mg by mouth daily.    [provider]    Family History History reviewed. No pertinent family history.  Social History Social History   Tobacco Use   Smoking status: Never   Smokeless tobacco: Never  Substance Use Topics   Alcohol use: Yes    Comment: daily   Drug use: Not Currently      Allergies   Patient has no known allergies.   Review of Systems Review of Systems  Musculoskeletal:  Positive for arthralgias and joint swelling.    Physical Exam Triage Vital Signs ED Triage Vitals  Enc Vitals Group     BP 12/15/20 0903 118/60     Pulse Rate 12/15/20 0901 73     Resp 12/15/20 0901 18     Temp 12/15/20 0901 99.2 F (37.3 C)     Temp Source 12/15/20 0901 Oral     SpO2 12/15/20 0901 100 %     Weight --      Height --      Head Circumference --      Peak Flow --      Pain Score 12/15/20 0900 0     Pain Loc --      Pain Edu? --      Excl. in [place]? --    No data found.  Updated Vital Signs BP 118/60   Pulse 73   Temp 99.2 F (37.3 C) (Oral)   Resp 18   SpO2 100%   Visual Acuity Right Eye Distance:   Left Eye Distance:   Bilateral Distance:    Right Eye Near:   Left Eye Near:    Bilateral Near:     Physical Exam Vitals and nursing note reviewed.  Constitutional:  General: He is not in acute distress.    Appearance: Normal appearance. He is not ill-appearing, toxic-appearing or diaphoretic.  HENT:     Head: Normocephalic and atraumatic.  Eyes:     Conjunctiva/sclera: Conjunctivae normal.  Cardiovascular:     Rate and Rhythm: Normal rate and regular rhythm.     Pulses: Normal pulses.     Heart sounds: Normal heart sounds.  Pulmonary:     Effort: Pulmonary effort is normal.     Breath sounds: Normal breath sounds.  Abdominal:     General: Abdomen is flat.  Musculoskeletal:        General: Normal range of motion.     Cervical back: Normal range of motion.     Right lower leg: Swelling present. No lacerations, tenderness or bony tenderness. No edema.     Left lower leg: No swelling. No edema.     Right ankle: Swelling present. No deformity or ecchymosis. Tenderness present. Normal range of motion. Anterior drawer test negative. Normal pulse.     Right Achilles Tendon: Normal.     Left ankle: Normal.  Skin:    General: Skin is  warm and dry.  Neurological:     General: No focal deficit present.     Mental Status: He is alert and oriented to person, place, and time.  Psychiatric:        Mood and Affect: Mood normal.     UC Treatments / Results  Labs (all labs ordered are listed, but only abnormal results are displayed) Labs Reviewed  CBG MONITORING, ED - Abnormal; Notable for the following components:      Result Value   Glucose-Capillary 190 (*)    All other components within normal limits    EKG   Radiology DG Ankle Complete Right  Result Date: 12/15/2020 CLINICAL DATA:  Right ankle pain and swelling for 2 weeks. No known injury. EXAM: RIGHT ANKLE - COMPLETE 3+ VIEW COMPARISON:  None. FINDINGS: There is no evidence of fracture, dislocation, or joint effusion. There is no evidence of arthropathy or other focal bone abnormality. Soft tissues are unremarkable. IMPRESSION: Negative. Electronically Signed   By: Marlaine Hind M.D.   On: 12/15/2020 10:03    Procedures Procedures (including critical care time)  Medications Ordered in UC Medications - No data to display  Initial Impression / Assessment and Plan / UC Course  I have reviewed the triage vital signs and the nursing notes.  Pertinent labs & imaging results that were available during my care of the patient were reviewed by me and considered in my medical decision making (see chart for details).    Assessment negative for red flags or concerns.   Right ankle swelling.  Xray with no acute abnormalities.   Wear compression socks when standing for walking for extended periods of time Elevated extremity over the hip when sitting or laying down.  May take Naproxen twice a day to help with pain and swelling Elevated Blood sugar CBG 190, but patient did eat prior to arrival so it is not fasting Diet information to help avoid elevated blood sugars given to patient  Follow up with primary care as soon as possible Final Clinical Impressions(s) / UC  Diagnoses   Final diagnoses:  Right ankle swelling  Elevated blood sugar     Discharge Instructions      Take the naproxen twice a day as needed to help with swelling.    You can swear a compression sock, especially when  standing or walking for long periods of time to help reduce swelling.   Elevated your foot/ankle above your hip when sitting or laying down.    For diabetes or elevated blood sugar, please make sure you are limiting and avoiding starchy, carbohydrate foods like pasta, breads, sweet breads, pastry, rice, potatoes, desserts. These foods can elevate your blood sugar. Also, limit and avoid drinks that contain a lot of sugar such as sodas, sweet teas, fruit juices.  Drinking plain water will be much more helpful, try 64 ounces of water daily.  It is okay to flavor your water naturally by cutting cucumber, lemon, mint or lime, placing it in a picture with water and drinking it over a period of 24-48 hours as long as it remains refrigerated.  For elevated blood pressure, make sure you are monitoring salt in your diet.  Do not eat restaurant foods and limit processed foods at home. I highly recommend you prepare and cook your own foods at home.  Processed foods include things like frozen meals, pre-seasoned meats and dinners, deli meats, canned foods as these foods contain a high amount of sodium/salt.  Make sure you are paying attention to sodium labels on foods you buy at the grocery store. Buy your spices separately such as garlic powder, onion powder, cumin, cayenne, parsley flakes so that you can avoid seasonings that contain salt. However, salt-free seasonings are available and can be used, an example is Mrs. Dash and includes a lot of different mixtures that do not contain salt.  Lastly, when cooking using oils that are healthier for you is important. This includes olive oil, avocado oil, canola oil. We have discussed a lot of foods to avoid but below is a list of foods that can  be very healthy to use in your diet whether it is for diabetes, cholesterol, high blood pressure, or in general healthy eating.  Salads - kale, spinach, cabbage, spring mix, arugula Fruits - avocadoes, berries (blueberries, raspberries, blackberries), apples, oranges, pomegranate, grapefruit, kiwi Vegetables - asparagus, cauliflower, broccoli, green beans, brussel sprouts, bell peppers, beets; stay away from or limit starchy vegetables like potatoes, carrots, peas Other general foods - kidney beans, egg whites, almonds, walnuts, sunflower seeds, pumpkin seeds, fat free yogurt, almond milk, flax seeds, quinoa, oats  Meat - It is better to eat lean meats and limit your red meat including pork to once a week.  Wild caught fish, chicken breast are good options as they tend to be leaner sources of good protein. Still be mindful of the sodium labels for the meats you buy.  DO NOT EAT ANY FOODS ON THIS LIST THAT YOU ARE ALLERGIC TO. For more specific needs, I highly recommend consulting a dietician or nutritionist but this can definitely be a good starting point.  Follow up with primary care for re-evaluation as soon as you are able.  Return or go to the Emergency Department if symptoms worsen or do not improve in the next few days.      ED Prescriptions     Medication Sig Dispense Auth. Provider   naproxen (NAPROSYN) 500 MG tablet Take 1 tablet (500 mg total) by mouth 2 (two) times daily. 30 tablet Pearson Forster, NP      PDMP not reviewed this encounter.   Pearson Forster, NP 12/15/20 1030

## 2021-01-11 ENCOUNTER — Ambulatory Visit: Payer: Self-pay | Admitting: Family Medicine

## 2021-01-19 ENCOUNTER — Ambulatory Visit: Payer: Self-pay | Admitting: Family Medicine

## 2021-02-23 ENCOUNTER — Other Ambulatory Visit: Payer: Self-pay

## 2021-02-23 ENCOUNTER — Ambulatory Visit (INDEPENDENT_AMBULATORY_CARE_PROVIDER_SITE_OTHER): Payer: 59 | Admitting: Family Medicine

## 2021-02-23 ENCOUNTER — Encounter: Payer: Self-pay | Admitting: Family Medicine

## 2021-02-23 VITALS — BP 118/88 | HR 87 | Temp 98.3°F | Wt 186.8 lb

## 2021-02-23 DIAGNOSIS — Z125 Encounter for screening for malignant neoplasm of prostate: Secondary | ICD-10-CM

## 2021-02-23 DIAGNOSIS — M25471 Effusion, right ankle: Secondary | ICD-10-CM

## 2021-02-23 DIAGNOSIS — Z131 Encounter for screening for diabetes mellitus: Secondary | ICD-10-CM

## 2021-02-23 DIAGNOSIS — R600 Localized edema: Secondary | ICD-10-CM | POA: Diagnosis not present

## 2021-02-23 NOTE — Progress Notes (Signed)
Subjective:    Patient ID: [name], male    DOB: 10/10/1965, 55 y.o.   MRN: [id number]  Chief Complaint  Patient presents with   Blood Sugar Problem    HPI Patient was seen today for f/u on ongoing conditions.  Patient endorses recent gaining weight.  Notes eating more as he is currently separated.  Patient endorses feeling like he is in a good place.  Patient notes right ankle edema times months.  Seen at [facility] on 12/15/2020 for same.  Denies injury, pain, numbness, tingling.  Patient notes when symptoms initially started the ankle was warm to the touch.  Blood sugar noted to be elevated at UC.  Patient inquires about checking for diabetes.  Past Medical History:  Diagnosis Date   High cholesterol    Stroke ([place])     No Known Allergies  ROS General: Denies fever, chills, night sweats, changes in weight, changes in appetite HEENT: Denies headaches, ear pain, changes in vision, rhinorrhea, sore throat CV: Denies CP, palpitations, SOB, orthopnea Pulm: Denies SOB, cough, wheezing GI: Denies abdominal pain, nausea, vomiting, diarrhea, constipation GU: Denies dysuria, hematuria, frequency Msk: Denies muscle cramps, joint pains R foot edema Neuro: Denies weakness, numbness, tingling Skin: Denies rashes, bruising Psych: Denies depression, anxiety, hallucinations     Objective:    Blood pressure 118/88, pulse 87, temperature 98.3 F (36.8 C), temperature source Oral, weight 186 lb 12.8 oz (84.7 kg), SpO2 97 %.  Gen. Pleasant, well-nourished, in no distress, normal affect   HEENT: [place]/AT, face symmetric, conjunctiva clear, no scleral icterus, PERRLA, EOMI, nares patent without drainage Lungs: no accessory muscle use, CTAB, no wheezes or rales Cardiovascular: RRR, no m/r/g, 1+ pedal edema of right foot to right lower shin.  LLE with 1+ edema in left shin Musculoskeletal: No deformities, no cyanosis or clubbing, normal tone Neuro:  A&Ox3, CN II-XII intact, normal gait Skin:   Warm, no lesions/ rash   Wt Readings from Last 3 Encounters:  02/23/21 186 lb 12.8 oz (84.7 kg)  09/28/19 160 lb (72.6 kg)  07/03/18 156 lb (70.8 kg)    Lab Results  Component Value Date   WBC 3.7 (L) 07/03/2018   HGB 15.8 07/03/2018   HCT 45.6 07/03/2018   PLT 140 07/03/2018   GLUCOSE 71 07/03/2018   CHOL 189 07/03/2018   TRIG 47 07/03/2018   HDL 82 07/03/2018   LDLCALC 93 07/03/2018   ALT 32 12/04/2017   AST 28 12/04/2017   NA 139 07/03/2018   K 4.0 07/03/2018   CL 103 07/03/2018   CREATININE 0.97 07/03/2018   BUN 15 07/03/2018   CO2 28 07/03/2018   PSA 1.4 07/03/2018   INR 0.99 12/04/2017   HGBA1C 5.0 07/03/2018    Assessment/Plan:  Edema of right ankle  -supportive care including elevation, compression, ice, heat, limiting sodium intake -Discussed obtaining labs to rule out PE, gout, CHF - Plan: Uric Acid, BMP with eGFR([facility]), CBC with Differential/Platelet, D-dimer, Quantitative, Brain Natriuretic Peptide  Screening for diabetes mellitus -Lifestyle modifications - Plan: Hemoglobin A1c  Screening for prostate cancer  - Plan: PSA  Edema of right foot -Supportive care as stated above  F/u as needed in the next month, sooner if needed  Grier Mitts, MD

## 2021-02-23 NOTE — Addendum Note (Signed)
Addended by: Amanda Cockayne on: 02/23/2021 03:20 PM   Modules accepted: Orders

## 2021-02-23 NOTE — Addendum Note (Signed)
Addended by: Amanda Cockayne on: 02/23/2021 03:32 PM   Modules accepted: Orders

## 2021-02-24 LAB — HEMOGLOBIN A1C
Hgb A1c MFr Bld: 5 % of total Hgb (ref <5.7)
Mean Plasma Glucose: 97 mg/dL
eAG (mmol/L): 5.4 mmol/L

## 2021-02-24 LAB — BASIC METABOLIC PANEL WITH GFR
BUN/Creatinine Ratio: 21 (calc) (ref 6–22)
BUN: 26 mg/dL — ABNORMAL HIGH (ref 7–25)
CO2: 26 mmol/L (ref 20–32)
Calcium: 9.1 mg/dL (ref 8.6–10.3)
Chloride: 105 mmol/L (ref 98–110)
Creat: 1.25 mg/dL (ref 0.70–1.30)
Glucose, Bld: 76 mg/dL (ref 65–99)
Potassium: 4.1 mmol/L (ref 3.5–5.3)
Sodium: 138 mmol/L (ref 135–146)
eGFR: 68 mL/min/{1.73_m2} (ref >=60)

## 2021-02-24 LAB — CBC WITH DIFFERENTIAL/PLATELET
Absolute Monocytes: 297 cells/uL (ref 200–950)
Basophils Absolute: 9 cells/uL (ref 0–200)
Basophils Relative: 0.3 %
Eosinophils Absolute: 99 cells/uL (ref 15–500)
Eosinophils Relative: 3.3 %
HCT: 44.9 % (ref 38.5–50.0)
Hemoglobin: 15 g/dL (ref 13.2–17.1)
Lymphs Abs: 879 cells/uL (ref 850–3900)
MCH: 30.7 pg (ref 27.0–33.0)
MCHC: 33.4 g/dL (ref 32.0–36.0)
MCV: 92 fL (ref 80.0–100.0)
MPV: 9.5 fL (ref 7.5–12.5)
Monocytes Relative: 9.9 %
Neutro Abs: 1716 cells/uL (ref 1500–7800)
Neutrophils Relative %: 57.2 %
Platelets: 132 10*3/uL — ABNORMAL LOW (ref 140–400)
RBC: 4.88 10*6/uL (ref 4.20–5.80)
RDW: 13.4 % (ref 11.0–15.0)
Total Lymphocyte: 29.3 %
WBC: 3 10*3/uL — ABNORMAL LOW (ref 3.8–10.8)

## 2021-02-24 LAB — D-DIMER, QUANTITATIVE: D-Dimer, Quant: 0.33 mcg/mL FEU (ref <0.50)

## 2021-02-24 LAB — URIC ACID: Uric Acid, Serum: 6.3 mg/dL (ref 4.0–8.0)

## 2021-02-24 LAB — PSA: PSA: 0.92 ng/mL (ref <=4.00)

## 2021-03-07 ENCOUNTER — Telehealth: Payer: 59 | Admitting: Family Medicine

## 2021-03-08 ENCOUNTER — Ambulatory Visit: Payer: 59 | Admitting: Family Medicine

## 2021-03-08 LAB — BRAIN NATRIURETIC PEPTIDE: Brain Natriuretic Peptide: <4 pg/mL (ref <100)

## 2021-03-09 ENCOUNTER — Ambulatory Visit (INDEPENDENT_AMBULATORY_CARE_PROVIDER_SITE_OTHER): Payer: 59 | Admitting: Family Medicine

## 2021-03-09 ENCOUNTER — Other Ambulatory Visit: Payer: Self-pay

## 2021-03-09 ENCOUNTER — Encounter: Payer: Self-pay | Admitting: Family Medicine

## 2021-03-09 VITALS — BP 118/90 | HR 77 | Temp 97.9°F | Ht 76.0 in | Wt 188.6 lb

## 2021-03-09 DIAGNOSIS — R350 Frequency of micturition: Secondary | ICD-10-CM | POA: Diagnosis not present

## 2021-03-09 DIAGNOSIS — Z1211 Encounter for screening for malignant neoplasm of colon: Secondary | ICD-10-CM | POA: Diagnosis not present

## 2021-03-09 LAB — POCT URINALYSIS DIPSTICK
Bilirubin, UA: NEGATIVE
Blood, UA: NEGATIVE
Glucose, UA: NEGATIVE
Ketones, UA: NEGATIVE
Leukocytes, UA: NEGATIVE
Nitrite, UA: NEGATIVE
Protein, UA: POSITIVE — AB
Spec Grav, UA: 1.025 (ref 1.010–1.025)
Urobilinogen, UA: NEGATIVE E.U./dL — AB
pH, UA: 6 (ref 5.0–8.0)

## 2021-03-09 LAB — GLUCOSE, POCT (MANUAL RESULT ENTRY): POC Glucose: 103 mg/dl — AB (ref 70–99)

## 2021-03-09 MED ORDER — TAMSULOSIN HCL 0.4 MG PO CAPS: 0.4000 mg | ORAL_CAPSULE | Freq: Every day | ORAL | 1 refills | Status: DC

## 2021-03-09 NOTE — Progress Notes (Signed)
Subjective:    Patient ID: [name], male    DOB: December 01, 1965, 55 y.o.   MRN: [id number]  Chief Complaint  Patient presents with   Follow-up    Wants to discuss colonoscopy results and see about getting a prostate exam    HPI Patient was seen today for several concerns.  Patient endorses increased urinary frequency with occasional leakage times few weeks.  He is in the restroom 10 times per day.  Denies dysuria,d/c, nausea/vomiting, constipation, back pain, increased caffeine intake or nocturia.  Pt inquires about scheduling prostate exam and new referral for colonoscopy.  PSA 0.92 on 02/23/21 Past Medical History:  Diagnosis Date   High cholesterol    Stroke ([place])     No Known Allergies  ROS General: Denies fever, chills, night sweats, changes in weight, changes in appetite HEENT: Denies headaches, ear pain, changes in vision, rhinorrhea, sore throat CV: Denies CP, palpitations, SOB, orthopnea Pulm: Denies SOB, cough, wheezing GI: Denies abdominal pain, nausea, vomiting, diarrhea, constipation GU: Denies dysuria, hematuria   +urinary frequency  Msk: Denies muscle cramps, joint pains Neuro: Denies weakness, numbness, tingling Skin: Denies rashes, bruising Psych: Denies depression, anxiety, hallucinations  Objective:    Blood pressure 118/90, pulse 77, temperature 97.9 F (36.6 C), temperature source Oral, height '6\' 4"'$  (1.93 m), weight 188 lb 9.6 oz (85.5 kg), SpO2 99 %.  Gen. Pleasant, well-nourished, in no distress, normal affect   HEENT: [place]/AT, face symmetric, conjunctiva clear, no scleral icterus, PERRLA, EOMI, nares patent without drainage Lungs: no accessory muscle use Cardiovascular: RRR, no peripheral edema GU: deferred Musculoskeletal: No deformities, no cyanosis or clubbing, normal tone Neuro:  A&Ox3, CN II-XII intact, normal gait Skin:  Warm, no lesions/ rash  Wt Readings from Last 3 Encounters:  03/09/21 188 lb 9.6 oz (85.5 kg)  02/23/21 186 lb 12.8  oz (84.7 kg)  09/28/19 160 lb (72.6 kg)    Lab Results  Component Value Date   WBC 3.0 (L) 02/23/2021   HGB 15.0 02/23/2021   HCT 44.9 02/23/2021   PLT 132 (L) 02/23/2021   GLUCOSE 76 02/23/2021   CHOL 189 07/03/2018   TRIG 47 07/03/2018   HDL 82 07/03/2018   LDLCALC 93 07/03/2018   ALT 32 12/04/2017   AST 28 12/04/2017   NA 138 02/23/2021   K 4.1 02/23/2021   CL 105 02/23/2021   CREATININE 1.25 02/23/2021   BUN 26 (H) 02/23/2021   CO2 26 02/23/2021   PSA 0.92 02/23/2021   INR 0.99 12/04/2017   HGBA1C 5.0 02/23/2021    Assessment/Plan:  Urinary frequency  -Discussed possible causes including BPH, UTI or other infection, diabetes -UA negative -Pt declined prostate exam.  PSA normal 0.92 on 02/23/21 -will start flomax daily  -for continued or worsened symptoms Urology referral. - Plan: POC Glucose (CBG), POCT urinalysis dipstick, tamsulosin (FLOMAX) 0.4 MG CAPS capsule  Screen for colon cancer - Plan: Ambulatory referral to Gastroenterology  F/u in 1 month  Grier Mitts, MD

## 2021-03-27 ENCOUNTER — Encounter: Payer: Self-pay | Admitting: Internal Medicine

## 2021-05-25 ENCOUNTER — Telehealth: Payer: Self-pay | Admitting: *Deleted

## 2021-05-25 NOTE — Telephone Encounter (Signed)
Patient was called several times since appointment time of 1030 am. No answer, left message for the patient to call us back before 5 pm to reschedule PV or the procedure would be cancelled.

## 2021-05-25 NOTE — Telephone Encounter (Signed)
Called patient, no answer, left message. No show letter mailed and PV & colon cancelled.

## 2021-06-08 ENCOUNTER — Encounter: Payer: 59 | Admitting: Internal Medicine

## 2022-01-02 ENCOUNTER — Ambulatory Visit (INDEPENDENT_AMBULATORY_CARE_PROVIDER_SITE_OTHER): Payer: Self-pay | Admitting: Family Medicine

## 2022-01-02 ENCOUNTER — Encounter: Payer: Self-pay | Admitting: Family Medicine

## 2022-01-02 VITALS — BP 120/92 | HR 79 | Temp 98.4°F | Wt 188.2 lb

## 2022-01-02 DIAGNOSIS — M21611 Bunion of right foot: Secondary | ICD-10-CM

## 2022-01-02 DIAGNOSIS — I10 Essential (primary) hypertension: Secondary | ICD-10-CM

## 2022-01-02 DIAGNOSIS — B351 Tinea unguium: Secondary | ICD-10-CM

## 2022-01-02 DIAGNOSIS — Z1211 Encounter for screening for malignant neoplasm of colon: Secondary | ICD-10-CM

## 2022-01-02 DIAGNOSIS — M21612 Bunion of left foot: Secondary | ICD-10-CM

## 2022-01-02 DIAGNOSIS — B070 Plantar wart: Secondary | ICD-10-CM

## 2022-01-02 DIAGNOSIS — L84 Corns and callosities: Secondary | ICD-10-CM

## 2022-01-02 NOTE — Patient Instructions (Signed)
Do not forget to set up an appointment for your physical in the next few weeks.

## 2022-01-02 NOTE — Progress Notes (Signed)
Subjective:    Patient ID: [name], male    DOB: 07-10-1965, 56 y.o.   MRN: [id number]  Chief Complaint  Patient presents with   OTHER    Pt c/o of pain on both feet. Been going on for around 2 year. Pain to walk.     HPI Patient was seen today for ongoing concerns.  Patient endorses bilateral foot pain times years.  Patient states that her 2 hard areas on bottom of feet that cause discomfort when he walks.  Patient also has a sore area on the lateral right fifth digit.  Patient typically wears steel toed boots or tennis shoes.  Denies walking around barefoot.  Patient also inquires about cancer screening.  States ready to have done. Past Medical History:  Diagnosis Date   High cholesterol    Stroke ([place])     No Known Allergies  ROS General: Denies fever, chills, night sweats, changes in weight, changes in appetite HEENT: Denies headaches, ear pain, changes in vision, rhinorrhea, sore throat CV: Denies CP, palpitations, SOB, orthopnea Pulm: Denies SOB, cough, wheezing GI: Denies abdominal pain, nausea, vomiting, diarrhea, constipation GU: Denies dysuria, hematuria, frequency Msk: Denies muscle cramps, joint pains  + pain in feet with walking Neuro: Denies weakness, numbness, tingling Skin: Denies rashes, bruising Psych: Denies depression, anxiety, hallucinations     Objective:    Blood pressure 114/90, pulse 79, temperature 98.4 F (36.9 C), temperature source Oral, weight 188 lb 3.2 oz (85.4 kg), SpO2 97 %.  Gen. Pleasant, well-nourished, in no distress, normal affect   HEENT: /AT, face symmetric, conjunctiva clear, no scleral icterus, PERRLA, EOMI, nares patent without drainage Lungs: no accessory muscle use Cardiovascular: RRR, no peripheral edema Musculoskeletal: No deformities, no cyanosis or clubbing, normal tone Neuro:  A&Ox3, CN II-XII intact, normal gait Skin:  Warm, no lesions/ rash.  Plantar surface of bilateral feet with plantar warts.  R 5th toe  with soft corn with erythema on lateral side.  B/l bunions.  Onychomycosis of several toes of bilateral feet.   Wt Readings from Last 3 Encounters:  01/02/22 188 lb 3.2 oz (85.4 kg)  03/09/21 188 lb 9.6 oz (85.5 kg)  02/23/21 186 lb 12.8 oz (84.7 kg)    Lab Results  Component Value Date   WBC 3.0 (L) 02/23/2021   HGB 15.0 02/23/2021   HCT 44.9 02/23/2021   PLT 132 (L) 02/23/2021   GLUCOSE 76 02/23/2021   CHOL 189 07/03/2018   TRIG 47 07/03/2018   HDL 82 07/03/2018   LDLCALC 93 07/03/2018   ALT 32 12/04/2017   AST 28 12/04/2017   NA 138 02/23/2021   K 4.1 02/23/2021   CL 105 02/23/2021   CREATININE 1.25 02/23/2021   BUN 26 (H) 02/23/2021   CO2 26 02/23/2021   PSA 0.92 02/23/2021   INR 0.99 12/04/2017   HGBA1C 5.0 02/23/2021    Assessment/Plan:  Plantar warts -Discussed options for removal including cryotherapy -Given handout -We will have patient follow-up with podiatry  - Plan: Ambulatory referral to Podiatry  Bilateral bunions  - Plan: Ambulatory referral to Podiatry  Corn of toe -Avoid wearing shoes that are tight in the toebox -Can use OTC pads to help reduce rubbing on side of R fifth toe - Plan: Ambulatory referral to Podiatry  Onychomycosis -Discussed foot hygiene including wearing clean socks daily, etc. -Discussed various treatment options likely to take several months before resolution noted. -Given handout - Plan: Ambulatory referral to Podiatry  Screen  for colon cancer  - Plan: Ambulatory referral to Gastroenterology  Essential hypertension -Patient again advised diastolic BP has remained elevated at 90 for several yrs, while systolic normal. -Discussed lifestyle modifications including decreasing sodium intake, exercising, drinking plenty of water. -Discussed the importance of monitoring this given family history of HTN in mom -Given handout  F/u prn in the next few weeks for CPE and labs.  Grier Mitts, MD

## 2022-01-31 ENCOUNTER — Encounter: Payer: Self-pay | Admitting: Family Medicine

## 2022-01-31 ENCOUNTER — Ambulatory Visit (INDEPENDENT_AMBULATORY_CARE_PROVIDER_SITE_OTHER): Payer: No Typology Code available for payment source | Admitting: Family Medicine

## 2022-01-31 VITALS — BP 116/82 | HR 73 | Temp 98.5°F | Ht 76.0 in | Wt 185.6 lb

## 2022-01-31 DIAGNOSIS — Z0000 Encounter for general adult medical examination without abnormal findings: Secondary | ICD-10-CM | POA: Diagnosis not present

## 2022-01-31 DIAGNOSIS — Z125 Encounter for screening for malignant neoplasm of prostate: Secondary | ICD-10-CM | POA: Diagnosis not present

## 2022-01-31 DIAGNOSIS — N529 Male erectile dysfunction, unspecified: Secondary | ICD-10-CM | POA: Diagnosis not present

## 2022-01-31 DIAGNOSIS — Z1211 Encounter for screening for malignant neoplasm of colon: Secondary | ICD-10-CM | POA: Diagnosis not present

## 2022-01-31 DIAGNOSIS — Z1159 Encounter for screening for other viral diseases: Secondary | ICD-10-CM | POA: Diagnosis not present

## 2022-01-31 LAB — BASIC METABOLIC PANEL
BUN: 26 mg/dL — ABNORMAL HIGH (ref 6–23)
CO2: 28 mEq/L (ref 19–32)
Calcium: 9.4 mg/dL (ref 8.4–10.5)
Chloride: 101 mEq/L (ref 96–112)
Creatinine, Ser: 1.26 mg/dL (ref 0.40–1.50)
GFR: 63.46 mL/min (ref >60.00)
Glucose, Bld: 85 mg/dL (ref 70–99)
Potassium: 4.5 mEq/L (ref 3.5–5.1)
Sodium: 139 mEq/L (ref 135–145)

## 2022-01-31 LAB — CBC WITH DIFFERENTIAL/PLATELET
Basophils Absolute: 0 10*3/uL (ref 0.0–0.1)
Basophils Relative: 0.6 % (ref 0.0–3.0)
Eosinophils Absolute: 0.1 10*3/uL (ref 0.0–0.7)
Eosinophils Relative: 3.4 % (ref 0.0–5.0)
HCT: 43.9 % (ref 39.0–52.0)
Hemoglobin: 15.1 g/dL (ref 13.0–17.0)
Lymphocytes Relative: 48.7 % — ABNORMAL HIGH (ref 12.0–46.0)
Lymphs Abs: 2.1 10*3/uL (ref 0.7–4.0)
MCHC: 34.4 g/dL (ref 30.0–36.0)
MCV: 91.6 fl (ref 78.0–100.0)
Monocytes Absolute: 0.3 10*3/uL (ref 0.1–1.0)
Monocytes Relative: 7.1 % (ref 3.0–12.0)
Neutro Abs: 1.7 10*3/uL (ref 1.4–7.7)
Neutrophils Relative %: 40.2 % — ABNORMAL LOW (ref 43.0–77.0)
Platelets: 138 10*3/uL — ABNORMAL LOW (ref 150.0–400.0)
RBC: 4.79 Mil/uL (ref 4.22–5.81)
RDW: 13.5 % (ref 11.5–15.5)
WBC: 4.3 10*3/uL (ref 4.0–10.5)

## 2022-01-31 LAB — TSH: TSH: 1.77 u[IU]/mL (ref 0.35–5.50)

## 2022-01-31 LAB — T4, FREE: Free T4: 0.88 ng/dL (ref 0.60–1.60)

## 2022-01-31 LAB — LIPID PANEL
Cholesterol: 199 mg/dL (ref 0–200)
HDL: 53.6 mg/dL (ref >39.00)
LDL Cholesterol: 128 mg/dL — ABNORMAL HIGH (ref 0–99)
NonHDL: 145.29
Total CHOL/HDL Ratio: 4
Triglycerides: 84 mg/dL (ref 0.0–149.0)
VLDL: 16.8 mg/dL (ref 0.0–40.0)

## 2022-01-31 LAB — HEMOGLOBIN A1C: Hgb A1c MFr Bld: 5.5 % (ref 4.6–6.5)

## 2022-01-31 LAB — PSA: PSA: 1.5 ng/mL (ref 0.10–4.00)

## 2022-01-31 MED ORDER — SILDENAFIL CITRATE 25 MG PO TABS: ORAL_TABLET | ORAL | 0 refills | Status: DC

## 2022-01-31 NOTE — Progress Notes (Signed)
Subjective:     [name] is a 56 y.o. male with PMH SIG for right OA s/p THR and is here for a comprehensive physical exam. The patient reports doing well.  Pt started a part-time job at [employer].  Pt is getting remarried.  Pt notes difficulty achieving an erection.  Unsure if able to achieve alone.  Patient denies new medications or supplements.  Patient typically eating healthy.  Denies stress, dysuria, difficulty starting stream, or incomplete bladder emptying.  Patient states he is ready to get colonoscopy.  Patient has appointment with podiatry coming up for plantar warts, bunions, corns, and onychomycosis noted during visit 01/02/2022.  Social History   Socioeconomic History   Marital status: Married    Spouse name: Not on file   Number of children: Not on file   Years of education: Not on file   Highest education level: Not on file  Occupational History   Not on file  Tobacco Use   Smoking status: Never   Smokeless tobacco: Never  Substance and Sexual Activity   Alcohol use: Yes    Comment: daily   Drug use: Not Currently   Sexual activity: Not on file  Other Topics Concern   Not on file  Social History Narrative   Not on file   Social Determinants of Health   Financial Resource Strain: Not on file  Food Insecurity: Not on file  Transportation Needs: Not on file  Physical Activity: Not on file  Stress: Not on file  Social Connections: Not on file  Intimate Partner Violence: Not on file   Health Maintenance  Topic Date Due   Hepatitis C Screening  Never done   TETANUS/TDAP  Never done   COLONOSCOPY (Pts 45-55yr Insurance coverage will need to be confirmed)  Never done   Zoster Vaccines- Shingrix (1 of 2) Never done   COVID-19 Vaccine (3 - Moderna series) 09/19/2020   INFLUENZA VACCINE  01/22/2022   HIV Screening  Completed   HPV VACCINES  Aged Out    The following portions of the patient's history were reviewed and updated as appropriate: allergies,  current medications, past family history, past medical history, past social history, past surgical history, and problem list.  Review of Systems Pertinent items noted in HPI and remainder of comprehensive ROS otherwise negative.   Objective:    BP 116/82 (BP Location: Right Arm, Patient Position: Sitting, Cuff Size: Normal)   Pulse 73   Temp 98.5 F (36.9 C) (Oral)   Ht '6\' 4"'$  (1.93 m)   Wt 185 lb 9.6 oz (84.2 kg)   SpO2 98%   BMI 22.59 kg/m  General appearance: alert, cooperative, and no distress Head: Normocephalic, without obvious abnormality, atraumatic Eyes: conjunctivae/corneas clear. PERRL, EOM's intact. Fundi benign. Ears: normal TM's and external ear canals both ears Nose: Nares normal. Septum midline. Mucosa normal. No drainage or sinus tenderness. Throat: lips, mucosa, and tongue normal; teeth and gums normal Neck: no adenopathy, no carotid bruit, no JVD, supple, symmetrical, trachea midline, and thyroid not enlarged, symmetric, no tenderness/mass/nodules Lungs: clear to auscultation bilaterally Heart: regular rate and rhythm, S1, S2 normal, no murmur, click, rub or gallop Abdomen: soft, non-tender; bowel sounds normal; no masses,  no organomegaly Extremities: extremities normal, atraumatic, no cyanosis or edema Pulses: 2+ and symmetric Skin: Skin color, texture, turgor normal. No rashes or lesions Lymph nodes: Cervical, supraclavicular, and axillary nodes normal. Neurologic: Alert and oriented X 3, normal strength and tone. Normal symmetric reflexes.  Normal coordination and gait    Assessment:    Healthy male exam with the ED   Plan:    Anticipatory guidance given including wearing seatbelts, smoke detectors in the home, increasing physical activity, increasing p.o. intake of water and vegetables. -Obtain labs -Order colonoscopy -Declines immunizations at this time -Given handout -Next CPE in 1 year See After Visit Summary for Counseling Recommendations   -  Plan: CBC with Differential/Platelet, Basic metabolic panel  Erectile dysfunction, unspecified erectile dysfunction type -Discussed the importance of self-care as underlying stress can often affect performance. -Lifestyle modifications -Start sildenafil 25 mg -Follow-up in 1 month  - Plan: CBC with Differential/Platelet, TSH, T4, Free, Hemoglobin A1c, Lipid panel, sildenafil (VIAGRA) 25 MG tablet  Screening for prostate cancer - Plan: PSA  Screen for colon cancer - Plan: Ambulatory referral to Gastroenterology  Encounter for hepatitis C screening test for low risk patient - Plan: Hep C Antibody  Follow-up in 1 month  Grier Mitts, MD

## 2022-02-01 LAB — HEPATITIS C ANTIBODY: Hepatitis C Ab: NONREACTIVE

## 2022-02-04 ENCOUNTER — Ambulatory Visit (INDEPENDENT_AMBULATORY_CARE_PROVIDER_SITE_OTHER): Payer: No Typology Code available for payment source

## 2022-02-04 ENCOUNTER — Ambulatory Visit (INDEPENDENT_AMBULATORY_CARE_PROVIDER_SITE_OTHER): Payer: No Typology Code available for payment source | Admitting: Podiatry

## 2022-02-04 DIAGNOSIS — M216X9 Other acquired deformities of unspecified foot: Secondary | ICD-10-CM

## 2022-02-04 DIAGNOSIS — M79672 Pain in left foot: Secondary | ICD-10-CM

## 2022-02-04 DIAGNOSIS — M21619 Bunion of unspecified foot: Secondary | ICD-10-CM

## 2022-02-04 DIAGNOSIS — M79671 Pain in right foot: Secondary | ICD-10-CM

## 2022-02-04 DIAGNOSIS — M21612 Bunion of left foot: Secondary | ICD-10-CM | POA: Diagnosis not present

## 2022-02-04 DIAGNOSIS — L84 Corns and callosities: Secondary | ICD-10-CM

## 2022-02-04 DIAGNOSIS — M21611 Bunion of right foot: Secondary | ICD-10-CM | POA: Diagnosis not present

## 2022-02-04 NOTE — Progress Notes (Signed)
  Subjective:  Patient ID: [name], male    DOB: 1965-12-13,  MRN: [id number]  Chief Complaint  Patient presents with   [place] of the feet     56 y.o. male presents with the above complaint. History confirmed with patient.  The bunions he is locking up seems to be mostly calluses.  They are under the big toe on the right foot and the fifth toe on both feet  Objective:  Physical Exam: warm, good capillary refill, no trophic changes or ulcerative lesions, normal DP and PT pulses, normal sensory exam, and tender painful hyperkeratotic lesions submetatarsal 5 bilateral, submetatarsal 1 right.  Relatively high arch with prominence of the first and fifth metatarsal heads plantar   Radiographs: Multiple views x-ray of both feet: no fracture, dislocation, swelling or degenerative changes noted and slight pes cavus foot type Assessment:   1. Plantar flexed metatarsal, unspecified laterality   2. Callus of foot   3. Pain in both feet      Plan:  Patient was evaluated and treated and all questions answered.  All symptomatic hyperkeratoses were safely debrided with a sterile #15 blade to patient's level of comfort without incident. We discussed preventative and palliative care of these lesions including supportive and accommodative shoegear, padding, prefabricated and custom molded accommodative orthoses, use of a pumice stone and lotions/creams daily.  Recommended using urea cream and salicylic acid and offloading with dancers pads which were dispensed.  We also discussed custom molded orthoses to offload these which she will consider.  Finally also discussed surgical correction with fifth metatarsal osteotomy if this did not improve.  He will return to see me as needed.  Return if symptoms worsen or fail to improve.

## 2022-02-04 NOTE — Patient Instructions (Signed)
Look for urea 94% with salicylic acid cream or ointment and apply to the thickened dry skin / calluses. This can be bought over the counter, at a pharmacy or online such as [employer].   The pads I gave you are called: dancer's pads. Place them in shoes to offload the area.

## 2022-02-15 ENCOUNTER — Encounter: Payer: Self-pay | Admitting: Internal Medicine

## 2022-02-28 ENCOUNTER — Telehealth: Payer: Self-pay | Admitting: Family Medicine

## 2022-02-28 DIAGNOSIS — N529 Male erectile dysfunction, unspecified: Secondary | ICD-10-CM

## 2022-02-28 MED ORDER — SILDENAFIL CITRATE 25 MG PO TABS: ORAL_TABLET | ORAL | 0 refills | Status: DC

## 2022-02-28 NOTE — Telephone Encounter (Signed)
Refill sent to pharmacy.   

## 2022-02-28 NOTE — Telephone Encounter (Signed)
Requesting refill of sildenafil (VIAGRA) 25 MG tablet   [id number] [place], [place] - [address]:  [phone] Fax:  [phone]

## 2022-03-01 ENCOUNTER — Other Ambulatory Visit: Payer: Self-pay | Admitting: Family Medicine

## 2022-03-01 DIAGNOSIS — N529 Male erectile dysfunction, unspecified: Secondary | ICD-10-CM

## 2022-03-06 ENCOUNTER — Ambulatory Visit: Payer: Self-pay | Admitting: Family Medicine

## 2022-03-08 ENCOUNTER — Ambulatory Visit (AMBULATORY_SURGERY_CENTER): Payer: Self-pay | Admitting: *Deleted

## 2022-03-08 VITALS — Ht 76.0 in | Wt 188.0 lb

## 2022-03-08 DIAGNOSIS — Z1211 Encounter for screening for malignant neoplasm of colon: Secondary | ICD-10-CM

## 2022-03-08 MED ORDER — NA SULFATE-K SULFATE-MG SULF 17.5-3.13-1.6 GM/177ML PO SOLN: 1.0000 | Freq: Once | ORAL | 0 refills | Status: AC

## 2022-03-08 NOTE — Progress Notes (Signed)
No egg or soy allergy known to patient  No issues known to pt with past sedation with any surgeries or procedures Patient denies ever being told they had issues or difficulty with intubation  No FH of Malignant Hyperthermia Pt is not on diet pills Pt is not on home 02  Pt is not on blood thinners  Pt denies issues with constipation  No A fib or A flutter Have any cardiac testing pending--NO Pt instructed to use Single[employer]com or GoodRx for a price reduction on prep   

## 2022-03-28 ENCOUNTER — Telehealth: Payer: No Typology Code available for payment source | Admitting: Family Medicine

## 2022-03-30 ENCOUNTER — Other Ambulatory Visit: Payer: Self-pay | Admitting: Family Medicine

## 2022-03-30 DIAGNOSIS — N529 Male erectile dysfunction, unspecified: Secondary | ICD-10-CM

## 2022-04-01 MED ORDER — SILDENAFIL CITRATE 25 MG PO TABS: ORAL_TABLET | ORAL | 0 refills | Status: DC

## 2022-04-03 ENCOUNTER — Telehealth: Payer: No Typology Code available for payment source | Admitting: Family Medicine

## 2022-04-10 ENCOUNTER — Other Ambulatory Visit: Payer: Self-pay | Admitting: Family Medicine

## 2022-04-10 ENCOUNTER — Encounter: Payer: Self-pay | Admitting: Internal Medicine

## 2022-04-10 DIAGNOSIS — N529 Male erectile dysfunction, unspecified: Secondary | ICD-10-CM

## 2022-04-15 ENCOUNTER — Telehealth (INDEPENDENT_AMBULATORY_CARE_PROVIDER_SITE_OTHER): Payer: No Typology Code available for payment source | Admitting: Family Medicine

## 2022-04-15 ENCOUNTER — Telehealth: Payer: Self-pay | Admitting: Family Medicine

## 2022-04-15 ENCOUNTER — Encounter: Payer: Self-pay | Admitting: Family Medicine

## 2022-04-15 DIAGNOSIS — N529 Male erectile dysfunction, unspecified: Secondary | ICD-10-CM | POA: Diagnosis not present

## 2022-04-15 MED ORDER — SILDENAFIL CITRATE 50 MG PO TABS: 50.0000 mg | ORAL_TABLET | Freq: Every day | ORAL | 5 refills | Status: DC | PRN

## 2022-04-15 NOTE — Progress Notes (Signed)
Virtual Visit via Telephone Note  I connected with [name] on 04/15/22 at  1:30 PM EDT by telephone and verified that I am speaking with the correct person using two identifiers.   I discussed the limitations, risks, security and privacy concerns of performing an evaluation and management service by telephone and the availability of in person appointments. I also discussed with the patient that there may be a patient responsible charge related to this service. The patient expressed understanding and agreed to proceed.  Location patient: home Location provider: work or home office Participants present for the call: patient, provider Patient did not have a visit in the prior 7 days to address this/these issue(s).   History of Present Illness: Pt seen for follow-up on ED. did not notice much improvement with sildenafil 25 mg.  Pt tried 50 mg which seemed to work better.  Denies headaches, elevation in BP, prolonged erection.  Patient states overall he is feeling good and working on diet by decreasing carbs.  Patient inquires about only being given 8 pills at a time.   Observations/Objective: Patient sounds cheerful and well on the phone. I do not appreciate any SOB. Speech and thought processing are grossly intact. Patient reported vitals:  Assessment and Plan:  Erectile dysfunction, unspecified erectile dysfunction type -We will do continue Rx for sildenafil 25 mg as improvement in symptoms noted with sildenafil 50 mg. -Advised insurance may limit number of pills given per prescription.  Patient can look into cost of generic medication and pay out-of-pocket to obtain increased supply. -Consider using good Rx or other discount programs to help with cost of medication. -Refills given - Plan: sildenafil (VIAGRA) 50 MG tablet  Follow Up Instructions:  F/u prn in 4-6 months   [id number] 5-10 [id number] 11-20 [id number] 21-30 I did not refer this patient for an OV in the next 24 hours for  this/these issue(s).  I discussed the assessment and treatment plan with the patient. The patient was provided an opportunity to ask questions and all were answered. The patient agreed with the plan and demonstrated an understanding of the instructions.   The patient was advised to call back or seek an in-person evaluation if the symptoms worsen or if the condition fails to improve as anticipated.  I provided 5 minutes of non-face-to-face time during this encounter.   [name], MD

## 2022-04-15 NOTE — Telephone Encounter (Signed)
Error- please disregard

## 2022-04-19 ENCOUNTER — Ambulatory Visit (AMBULATORY_SURGERY_CENTER): Payer: No Typology Code available for payment source | Admitting: Internal Medicine

## 2022-04-19 ENCOUNTER — Encounter: Payer: Self-pay | Admitting: Internal Medicine

## 2022-04-19 VITALS — BP 109/69 | HR 69 | Temp 97.8°F | Resp 12 | Ht 76.0 in | Wt 188.0 lb

## 2022-04-19 DIAGNOSIS — D122 Benign neoplasm of ascending colon: Secondary | ICD-10-CM | POA: Diagnosis not present

## 2022-04-19 DIAGNOSIS — D125 Benign neoplasm of sigmoid colon: Secondary | ICD-10-CM

## 2022-04-19 DIAGNOSIS — K635 Polyp of colon: Secondary | ICD-10-CM | POA: Diagnosis not present

## 2022-04-19 DIAGNOSIS — Z1211 Encounter for screening for malignant neoplasm of colon: Secondary | ICD-10-CM | POA: Diagnosis present

## 2022-04-19 MED ORDER — SODIUM CHLORIDE 0.9 % IV SOLN: 500.0000 mL | Freq: Once | INTRAVENOUS | Status: DC

## 2022-04-19 NOTE — Progress Notes (Signed)
Pt resting comfortably. VSS. Airway intact. SBAR complete to RN. All questions answered.   

## 2022-04-19 NOTE — Progress Notes (Signed)
Called to room to assist during endoscopic procedure.  Patient ID and intended procedure confirmed with present staff. Received instructions for my participation in the procedure from the performing physician.  

## 2022-04-19 NOTE — Progress Notes (Signed)
Cell phone off per pt Pt's states no medical or surgical changes since previsit or office visit.  

## 2022-04-19 NOTE — Progress Notes (Signed)
GASTROENTEROLOGY PROCEDURE H&P NOTE   Primary Care Physician: [name], MD    Reason for Procedure:   Colon cancer screening  Plan:    Colonoscopy  Patient is appropriate for endoscopic procedure(s) in the ambulatory ([place]) setting.  The nature of the procedure, as well as the risks, benefits, and alternatives were carefully and thoroughly reviewed with the patient. Ample time for discussion and questions allowed. The patient understood, was satisfied, and agreed to proceed.     HPI: [name] is a 56 y.o. male who presents for colonoscopy for colon cancer screening. Denies blood in stools, changes in bowel habits, weight loss. Denies family history of colon cancer.  Past Medical History:  Diagnosis Date   High cholesterol    Stroke [facility])     Past Surgical History:  Procedure Laterality Date   TOTAL HIP ARTHROPLASTY Right 12/05/2017   Procedure: TOTAL HIP ARTHROPLASTY;  Surgeon: Earlie Server, MD;  Location: [place];  Service: Orthopedics;  Laterality: Right;    Prior to Admission medications   Medication Sig Start Date End Date Taking? Authorizing Provider  aspirin 325 MG EC tablet Take 325 mg by mouth daily. Patient not taking: Reported on 03/08/2022    [provider]  naproxen (NAPROSYN) 500 MG tablet Take 1 tablet (500 mg total) by mouth 2 (two) times daily. Patient not taking: Reported on 01/31/2022 12/15/20   Pearson Forster, NP  sildenafil (VIAGRA) 50 MG tablet Take 1 tablet (50 mg total) by mouth daily as needed for erectile dysfunction. Patient not taking: Reported on 04/19/2022 04/15/22   [name], MD  tamsulosin (FLOMAX) 0.4 MG CAPS capsule Take 1 capsule (0.4 mg total) by mouth daily. Patient not taking: Reported on 03/08/2022 03/09/21   [name], MD    Current Outpatient Medications  Medication Sig Dispense Refill   aspirin 325 MG EC tablet Take 325 mg by mouth daily. (Patient not taking: Reported on 03/08/2022)      naproxen (NAPROSYN) 500 MG tablet Take 1 tablet (500 mg total) by mouth 2 (two) times daily. (Patient not taking: Reported on 01/31/2022) 30 tablet 0   sildenafil (VIAGRA) 50 MG tablet Take 1 tablet (50 mg total) by mouth daily as needed for erectile dysfunction. (Patient not taking: Reported on 04/19/2022) 10 tablet 5   tamsulosin (FLOMAX) 0.4 MG CAPS capsule Take 1 capsule (0.4 mg total) by mouth daily. (Patient not taking: Reported on 03/08/2022) 30 capsule 1   Current Facility-Administered Medications  Medication Dose Route Frequency Provider Last Rate Last Admin   0.9 %  sodium chloride infusion  500 mL Intravenous Once Sharyn Creamer, MD        Allergies as of 04/19/2022   (No Known Allergies)    Family History  Problem Relation Age of Onset   Colon cancer Neg Hx    Colon polyps Neg Hx    Crohn's disease Neg Hx    Esophageal cancer Neg Hx    Rectal cancer Neg Hx    Stomach cancer Neg Hx    Ulcerative colitis Neg Hx     Social History   Socioeconomic History   Marital status: Married    Spouse name: Not on file   Number of children: Not on file   Years of education: Not on file   Highest education level: Not on file  Occupational History   Not on file  Tobacco Use   Smoking status: Never    Passive exposure:  Current   Smokeless tobacco: Never  Vaping Use   Vaping Use: Never used  Substance and Sexual Activity   Alcohol use: Yes    Comment: SOCIALLY   Drug use: Not Currently   Sexual activity: Not on file  Other Topics Concern   Not on file  Social History Narrative   Not on file   Social Determinants of Health   Financial Resource Strain: Not on file  Food Insecurity: Not on file  Transportation Needs: Not on file  Physical Activity: Not on file  Stress: Not on file  Social Connections: Not on file  Intimate Partner Violence: Not on file    Physical Exam: Vital signs in last 24 hours: BP 110/64   Pulse 90   Temp 97.8 F (36.6 C)   Ht '6\' 4"'$  (1.93  m)   Wt 188 lb (85.3 kg)   SpO2 99%   BMI 22.88 kg/m  GEN: NAD EYE: Sclerae anicteric ENT: MMM CV: Non-tachycardic Pulm: No increased work of breathing GI: Soft, NT/ND NEURO:  Alert & Oriented   Christia Reading, MD [facility]  04/19/2022 7:55 AM

## 2022-04-19 NOTE — Op Note (Signed)
[facility] Patient Name: [name] Procedure Date: 04/19/2022 8:04 AM MRN: [id number] Endoscopist: Adline Mango [place] , , [id number] Age: 56 Referring MD:  Date of Birth: 12/14/1965 Gender: Male Account #: [id number] Procedure:                Colonoscopy Indications:              Screening for colorectal malignant neoplasm, This                            is the patient's first colonoscopy Medicines:                Monitored Anesthesia Care Procedure:                Pre-Anesthesia Assessment:                           - Prior to the procedure, a History and Physical                            was performed, and patient medications and                            allergies were reviewed. The patient's tolerance of                            previous anesthesia was also reviewed. The risks                            and benefits of the procedure and the sedation                            options and risks were discussed with the patient.                            All questions were answered, and informed consent                            was obtained. Prior Anticoagulants: The patient has                            taken no anticoagulant or antiplatelet agents. ASA                            Grade Assessment: II - A patient with mild systemic                            disease. After reviewing the risks and benefits,                            the patient was deemed in satisfactory condition to                            undergo the procedure.  After obtaining informed consent, the colonoscope                            was passed under direct vision. Throughout the                            procedure, the patient's blood pressure, pulse, and                            oxygen saturations were monitored continuously. The                            Olympus CF-HQ190L ([id number]) Colonoscope was                            introduced through the anus  and advanced to the the                            cecum, identified by appendiceal orifice and                            ileocecal valve. The colonoscopy was performed                            without difficulty. The patient tolerated the                            procedure well. The quality of the bowel                            preparation was good. The ileocecal valve,                            appendiceal orifice, and rectum were photographed. Scope In: 8:11:18 AM Scope Out: 8:32:25 AM Scope Withdrawal Time: 0 hours 16 minutes 1 second  Total Procedure Duration: 0 hours 21 minutes 7 seconds  Findings:                 A few diverticula were found in the ascending colon.                           Four sessile polyps were found in the ascending                            colon. The polyps were 3 to 11 mm in size. These                            polyps were removed with a cold snare. Resection                            and retrieval were complete.                           Two sessile polyps were found in the  sigmoid colon.                            The polyps were 3 to 4 mm in size. These polyps                            were removed with a cold snare. Resection and                            retrieval were complete.                           Non-bleeding internal hemorrhoids were found during                            retroflexion. Complications:            No immediate complications. Estimated Blood Loss:     Estimated blood loss was minimal. Impression:               - Diverticulosis in the ascending colon.                           - Four 3 to 11 mm polyps in the ascending colon,                            removed with a cold snare. Resected and retrieved.                           - Two 3 to 4 mm polyps in the sigmoid colon,                            removed with a cold snare. Resected and retrieved.                           - Non-bleeding internal  hemorrhoids. Recommendation:           - Discharge patient to home (with escort).                           - Await pathology results.                           - The findings and recommendations were discussed                            with the patient. Dr Georgian Co "Lyndee Leo" Lorenso Courier,  04/19/2022 8:42:05 AM

## 2022-04-19 NOTE — Patient Instructions (Addendum)
-   Discharge patient to home (with escort). - Await pathology results. - The findings and recommendations were discussed with the patient. -Handouts on Polyps given.  YOU HAD AN ENDOSCOPIC PROCEDURE TODAY AT [facility]:   Refer to the procedure report that was given to you for any specific questions about what was found during the examination.  If the procedure report does not answer your questions, please call your gastroenterologist to clarify.  If you requested that your care partner not be given the details of your procedure findings, then the procedure report has been included in a sealed envelope for you to review at your convenience later.  YOU SHOULD EXPECT: Some feelings of bloating in the abdomen. Passage of more gas than usual.  Walking can help get rid of the air that was put into your GI tract during the procedure and reduce the bloating. If you had a lower endoscopy (such as a colonoscopy or flexible sigmoidoscopy) you may notice spotting of blood in your stool or on the toilet paper. If you underwent a bowel prep for your procedure, you may not have a normal bowel movement for a few days.  Please Note:  You might notice some irritation and congestion in your nose or some drainage.  This is from the oxygen used during your procedure.  There is no need for concern and it should clear up in a day or so.  SYMPTOMS TO REPORT IMMEDIATELY:  Following lower endoscopy (colonoscopy or flexible sigmoidoscopy):  Excessive amounts of blood in the stool  Significant tenderness or worsening of abdominal pains  Swelling of the abdomen that is new, acute  Fever of 100F or higher  For urgent or emergent issues, a gastroenterologist can be reached at any hour by calling [phone]. Do not use MyChart messaging for urgent concerns.    DIET:  We do recommend a small meal at first, but then you may proceed to your regular diet.  Drink plenty of fluids but you should avoid  alcoholic beverages for 24 hours.  ACTIVITY:  You should plan to take it easy for the rest of today and you should NOT DRIVE or use heavy machinery until tomorrow (because of the sedation medicines used during the test).    FOLLOW UP: Our staff will call the number listed on your records the next business day following your procedure.  We will call around 7:15- 8:00 am to check on you and address any questions or concerns that you may have regarding the information given to you following your procedure. If we do not reach you, we will leave a message.     If any biopsies were taken you will be contacted by phone or by letter within the next 1-3 weeks.  Please call us at [phone] if you have not heard about the biopsies in 3 weeks.    SIGNATURES/CONFIDENTIALITY: You and/or your care partner have signed paperwork which will be entered into your electronic medical record.  These signatures attest to the fact that that the information above on your After Visit Summary has been reviewed and is understood.  Full responsibility of the confidentiality of this discharge information lies with you and/or your care-partner.

## 2022-04-22 ENCOUNTER — Telehealth: Payer: Self-pay

## 2022-04-22 NOTE — Telephone Encounter (Signed)
Attempted to make post-procedure f/u call. Line is busy, unable to leave message.

## 2022-04-23 ENCOUNTER — Encounter: Payer: Self-pay | Admitting: Internal Medicine

## 2022-05-31 ENCOUNTER — Telehealth: Payer: Self-pay | Admitting: Family Medicine

## 2022-05-31 NOTE — Telephone Encounter (Signed)
Attempt to reach pt and left a detail message for pt that he is still has some refills on his viagra at his pharmacy. Will forward the message to the provider on the naproxen. To call back if have any question  Okay to refill the naproxen? Please advise.

## 2022-05-31 NOTE — Telephone Encounter (Signed)
Pt called to request refills of the following:  sildenafil (VIAGRA) 50 MG tablet   naproxen (NAPROSYN) 500 MG tablet   [id number] [place] [facility] [address]: [phone] Fax: [phone]    LOV:  01/31/22  (CPE)

## 2022-06-01 ENCOUNTER — Other Ambulatory Visit: Payer: Self-pay | Admitting: Family Medicine

## 2022-06-01 DIAGNOSIS — N529 Male erectile dysfunction, unspecified: Secondary | ICD-10-CM

## 2022-06-14 NOTE — Telephone Encounter (Signed)
Naproxen was last filled by this provider in 2022.  Available over-the-counter.

## 2022-06-18 NOTE — Telephone Encounter (Signed)
Patient is aware. Verbalized understanding.

## 2022-07-23 ENCOUNTER — Other Ambulatory Visit: Payer: Self-pay | Admitting: Family Medicine

## 2022-07-23 DIAGNOSIS — N529 Male erectile dysfunction, unspecified: Secondary | ICD-10-CM

## 2022-10-16 ENCOUNTER — Other Ambulatory Visit: Payer: Self-pay | Admitting: Family Medicine

## 2022-12-12 ENCOUNTER — Ambulatory Visit (INDEPENDENT_AMBULATORY_CARE_PROVIDER_SITE_OTHER): Payer: Self-pay | Admitting: Family Medicine

## 2022-12-12 ENCOUNTER — Encounter: Payer: Self-pay | Admitting: Family Medicine

## 2022-12-12 VITALS — BP 120/88 | HR 65 | Temp 98.5°F | Wt 200.2 lb

## 2022-12-12 DIAGNOSIS — Z8673 Personal history of transient ischemic attack (TIA), and cerebral infarction without residual deficits: Secondary | ICD-10-CM

## 2022-12-12 NOTE — Progress Notes (Signed)
Established Patient Office Visit   Subjective  Patient ID: [name], male    DOB: June 07, 1966  Age: 57 y.o. MRN: [id number]  Chief Complaint  Patient presents with   Referral    DOT saw he had a stroke, wants him to see a neurologists to get cleared.     Patient is a 57 year old male seen for acute need.  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  During physical earlier this week was advised he would need clearance from neurology given remote history of stroke.  Patient not currently on medications and has no residual effects from CVA.    Past Medical History:  Diagnosis Date   High cholesterol    Stroke [facility])    Past Surgical History:  Procedure Laterality Date   TOTAL HIP ARTHROPLASTY Right 12/05/2017   Procedure: TOTAL HIP ARTHROPLASTY;  Surgeon: Frederico Hamman, MD;  Location: [facility] OR;  Service: Orthopedics;  Laterality: Right;   Social History   Tobacco Use   Smoking status: Never    Passive exposure: Current   Smokeless tobacco: Never  Vaping Use   Vaping Use: Never used  Substance Use Topics   Alcohol use: Yes    Comment: SOCIALLY   Drug use: Not Currently   Family History  Problem Relation Age of Onset   Colon cancer Neg Hx    Colon polyps Neg Hx    Crohn's disease Neg Hx    Esophageal cancer Neg Hx    Rectal cancer Neg Hx    Stomach cancer Neg Hx    Ulcerative colitis Neg Hx    No Known Allergies    ROS Negative unless stated above    Objective:     BP 120/88 (BP Location: Right Arm, Patient Position: Sitting, Cuff Size: Normal)   Pulse 65   Temp 98.5 F (36.9 C) (Oral)   Wt 200 lb 3.2 oz (90.8 kg)   SpO2 98%   BMI 24.37 kg/m  BP Readings from Last 3 Encounters:  12/12/22 120/88  04/19/22 109/69  01/31/22 116/82   Wt Readings from Last 3 Encounters:  12/12/22 200 lb 3.2 oz (90.8 kg)  04/19/22 188 lb (85.3 kg)  03/08/22 188 lb (85.3 kg)      Physical Exam Constitutional:       General: He is not in acute distress.    Appearance: Normal appearance.  HENT:     Head: Normocephalic and atraumatic.     Nose: Nose normal.     Mouth/Throat:     Mouth: Mucous membranes are moist.  Cardiovascular:     Rate and Rhythm: Normal rate and regular rhythm.     Heart sounds: Normal heart sounds. No murmur heard.    No gallop.  Pulmonary:     Effort: Pulmonary effort is normal. No respiratory distress.     Breath sounds: Normal breath sounds. No wheezing, rhonchi or rales.  Skin:    General: Skin is warm and dry.  Neurological:     Mental Status: He is alert and oriented to person, place, and time.      No results found for any visits on 12/12/22.    Assessment & Plan:  History of CVA (cerebrovascular accident) without residual deficits -     Ambulatory referral to Neurology  Referral to neuro placed for eval and clearance per DOT request given history of CVA.  Return in about 2 months (around 02/11/2023), or if symptoms  worsen or fail to improve, for physical.   Deeann Saint, MD

## 2022-12-30 ENCOUNTER — Encounter: Payer: Self-pay | Admitting: Neurology

## 2022-12-30 ENCOUNTER — Ambulatory Visit (INDEPENDENT_AMBULATORY_CARE_PROVIDER_SITE_OTHER): Payer: Self-pay | Admitting: Neurology

## 2022-12-30 VITALS — BP 125/83 | HR 76 | Ht 77.0 in | Wt 199.5 lb

## 2022-12-30 DIAGNOSIS — E7849 Other hyperlipidemia: Secondary | ICD-10-CM

## 2022-12-30 DIAGNOSIS — Z8673 Personal history of transient ischemic attack (TIA), and cerebral infarction without residual deficits: Secondary | ICD-10-CM

## 2022-12-30 NOTE — Progress Notes (Signed)
[facility] [address] [place]. [place] [address] [phone]       OFFICE CONSULT NOTE  [name] Date of Birth:  May 18, 1966 Medical Record Number:  [id number]   Referring MD:  Deeann Saint  Reason for Referral:  h/o stroke Neurology  clearance for DOT physical  HPI: [name] is a 57 year old African-American male seen today for office consultation visit for neurological clearance for DOT physical.  History is obtained from the patient and review of electronic medical records.  I personally reviewed pertinent available imaging films in PACS.  He has past medical history of hyperlipidemia and left MCA branch infarct in 2006 when he was admitted with mild aphasia and right-sided weakness.  MRI showed left MCA frontal branch infarct and MRI showed left distal middle cerebral artery occlusion.  MRA neck was unremarkable.  Transthoracic echo showed normal ejection fraction.  Cardiac monitoring did not show significant cardiac arrhythmias.  He has been smoking marijuana and cigarettes and was advised to quit.  He started on aspirin and did well.  He was last seen by me in the office on 10/02/2017 for neurological clearance for hip surgery.  Patient continues to do well without recurrent stroke or TIA symptoms.  He states he was taking aspirin but stopped it a few weeks ago.  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  He has no neurological complaints.  Lab work on 01/31/2022 showed LDL cholesterol 128 mg percent and hemoglobin A1c 5.5. Prior office visit 10/02/2017 : [name] is a 57 year old pleasant African-American male who was referred for neurological clearance for hip surgery. He had a left MCA branch infarct in 2006 and was admitted with mild aphasia and right-sided weakness. I have reviewed some available neurological records from Dr. Marcelino Freestone from that admission. MRI showed left MCA frontal branch  infarct in MRA of the brain showed left distal middle cerebral artery occlusion and MRA of the neck was unremarkable. Transthoracic echo had shown normal ejection fraction. Cardiac monitoring apparently during hospitalization was negative. It is unclear whether the patient had a transesophageal echocardiogram or prolonged outpatient cardiac monitoring done.e denied abusing drugs at that time but may have been smoking marijuana which he has since quit Patient was supposed to be on aspirin but is states is not been taking it regularly. He takes it probably 3 or 4 days a week. He was also supposed to be on lipid lowering medications but presently his not taking it either. He states his made for neurological recovery speech and language improved and he has good strength. His been mainly bothered by significant right hip pain due to degenerative arthritis. He can barely walk now as he has trouble bending his head. He plans on having hip surgery done by Dr. Madelon Lips and is here to see me for neurological clearance. Review of electronic medical records does not show any recent brain imaging studies are recent labwork pertinent to stroke care. He denies any recurrent symptoms of stroke or TIA following his initial stroke in 2006.  ROS:   14 system review of systems is positive for no complaints today  PMH:  Past Medical History:  Diagnosis Date   High cholesterol    Stroke [facility])     Social History:  Social History   Socioeconomic History   Marital status: Legally Separated    Spouse name: Not on file   Number of children: Not on file  Years of education: Not on file   Highest education level: Not on file  Occupational History   Not on file  Tobacco Use   Smoking status: Never    Passive exposure: Current   Smokeless tobacco: Never  Vaping Use   Vaping Use: Never used  Substance and Sexual Activity   Alcohol use: Yes    Comment: SOCIALLY   Drug use: Not Currently   Sexual activity: Not on file   Other Topics Concern   Not on file  Social History Narrative   Not on file   Social Determinants of Health   Financial Resource Strain: Not on file  Food Insecurity: Not on file  Transportation Needs: Not on file  Physical Activity: Not on file  Stress: Not on file  Social Connections: Not on file  Intimate Partner Violence: Not on file    Medications:   Current Outpatient Medications on File Prior to Visit  Medication Sig Dispense Refill   sildenafil (VIAGRA) 25 MG tablet Take 1-2 tablets (25-50 mg total) by mouth as needed for erectile dysfunction. 10 tablet 4   No current facility-administered medications on file prior to visit.    Allergies:  No Known Allergies  Physical Exam General: well developed, well nourished, middle-aged African-American male seated, in no evident distress Head: head normocephalic and atraumatic.   Neck: supple with no carotid or supraclavicular bruits Cardiovascular: regular rate and rhythm, no murmurs Musculoskeletal: no deformity Skin:  no rash/petichiae Vascular:  Normal pulses all extremities  Neurologic Exam Mental Status: Awake and fully alert. Oriented to place and time. Recent and remote memory intact. Attention span, concentration and fund of knowledge appropriate. Mood and affect appropriate.  Cranial Nerves: Fundoscopic exam reveals sharp disc margins. Pupils equal, briskly reactive to light. Extraocular movements full without nystagmus. Visual fields full to confrontation. Hearing intact. Facial sensation intact. Face, tongue, palate moves normally and symmetrically.  Motor: Normal bulk and tone. Normal strength in all tested extremity muscles. Sensory.: intact to touch , pinprick , position and vibratory sensation.  Coordination: Rapid alternating movements normal in all extremities. Finger-to-nose and heel-to-shin performed accurately bilaterally. Gait and Station: Arises from chair without difficulty. Stance is normal. Gait  demonstrates normal stride length and balance . Able to heel, toe and tandem walk without difficulty.  Reflexes: 1+ and symmetric. Toes downgoing.   NIHSS  0 Modified Rankin  0   ASSESSMENT: 57 year old African-American male with cryptogenic left MCA branch infarct in 2006 with excellent clinical recovery with no neurological residue.Vascular risk factors of mild hyperlipidemia and prior stroke only     PLAN:I had a long d/w patient about his remote crytogenic stroke, risk for recurrent stroke/TIAs, personally independently reviewed imaging studies and stroke evaluation results and answered questions.I strongly encouraged him to start taking aspirin 81 mg daily  for secondary stroke prevention and maintain strict control of hypertension with blood pressure goal below 130/90, diabetes with hemoglobin A1c goal below 6.5% and lipids with LDL cholesterol goal below 70 mg/dL. I also advised the patient to eat a healthy diet with plenty of whole grains, cereals, fruits and vegetables, exercise regularly and maintain ideal body weight.Check lipid profile and hemoglobin A1c today.. 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 Greater than 50% time during this 45 minute consultation visit was spent on counseling and coordination of care about his remote  stroke and discussion aboutstroke prevention and neurological clearance for surgery and answering questions.Followup in the future with me only as necessary and no routine scheduled appointment was made.   Delia Heady, MD Note: This document was prepared with digital dictation and possible smart phrase technology. Any transcriptional errors that result from this process are unintentional.

## 2022-12-30 NOTE — Patient Instructions (Signed)
I had a long d/w patient about his remote crytogenic stroke, risk for recurrent stroke/TIAs, personally independently reviewed imaging studies and stroke evaluation results and answered questions.I strongly encouraged him to start taking aspirin 81 mg daily  for secondary stroke prevention and maintain strict control of hypertension with blood pressure goal below 130/90, diabetes with hemoglobin A1c goal below 6.5% and lipids with LDL cholesterol goal below 70 mg/dL. I also advised the patient to eat a healthy diet with plenty of whole grains, cereals, fruits and vegetables, exercise regularly and maintain ideal body weight.Check lipid profile and hemoglobin A1c today.. 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

## 2022-12-31 LAB — LIPID PANEL
Chol/HDL Ratio: 3.4 ratio (ref 0.0–5.0)
Cholesterol, Total: 232 mg/dL — ABNORMAL HIGH (ref 100–199)
HDL: 68 mg/dL (ref >39)
LDL Chol Calc (NIH): 142 mg/dL — ABNORMAL HIGH (ref 0–99)
Triglycerides: 124 mg/dL (ref 0–149)
VLDL Cholesterol Cal: 22 mg/dL (ref 5–40)

## 2022-12-31 LAB — HEMOGLOBIN A1C
Est. average glucose Bld gHb Est-mCnc: 108 mg/dL
Hgb A1c MFr Bld: 5.4 % (ref 4.8–5.6)

## 2023-01-01 ENCOUNTER — Telehealth: Payer: Self-pay | Admitting: Anesthesiology

## 2023-01-01 ENCOUNTER — Telehealth: Payer: Self-pay

## 2023-01-01 NOTE — Telephone Encounter (Signed)
-----   Message from Saima Athar, MD sent at 01/01/2023  2:01 PM EDT ----- Cholesterol and "bad cholesterol" called LDL are both elevated, I recommend FU with PCP to discuss optimizing cholesterol management, may need to start medication. A1c (diabetes marker) ok. 

## 2023-01-01 NOTE — Telephone Encounter (Signed)
-----   Message from Huston Foley, MD sent at 01/01/2023  2:01 PM EDT ----- Cholesterol and "bad cholesterol" called LDL are both elevated, I recommend FU with PCP to discuss optimizing cholesterol management, may need to start medication. A1c (diabetes marker) ok.

## 2023-01-01 NOTE — Telephone Encounter (Signed)
Called patient and LVM to call us back for results.

## 2023-02-25 ENCOUNTER — Ambulatory Visit: Payer: No Typology Code available for payment source | Admitting: Neurology

## 2023-04-29 ENCOUNTER — Other Ambulatory Visit: Payer: Self-pay | Admitting: Family Medicine

## 2023-04-29 DIAGNOSIS — N529 Male erectile dysfunction, unspecified: Secondary | ICD-10-CM

## 2023-06-15 IMAGING — DX DG ANKLE COMPLETE 3+V*R*
3 series · 3 of 3 positions shown · non-contrast
Comparison: None.

CLINICAL DATA: Right ankle pain and swelling for 2 weeks. No known
injury.

EXAM:
RIGHT ANKLE - COMPLETE 3+ VIEW

[ankle ap]
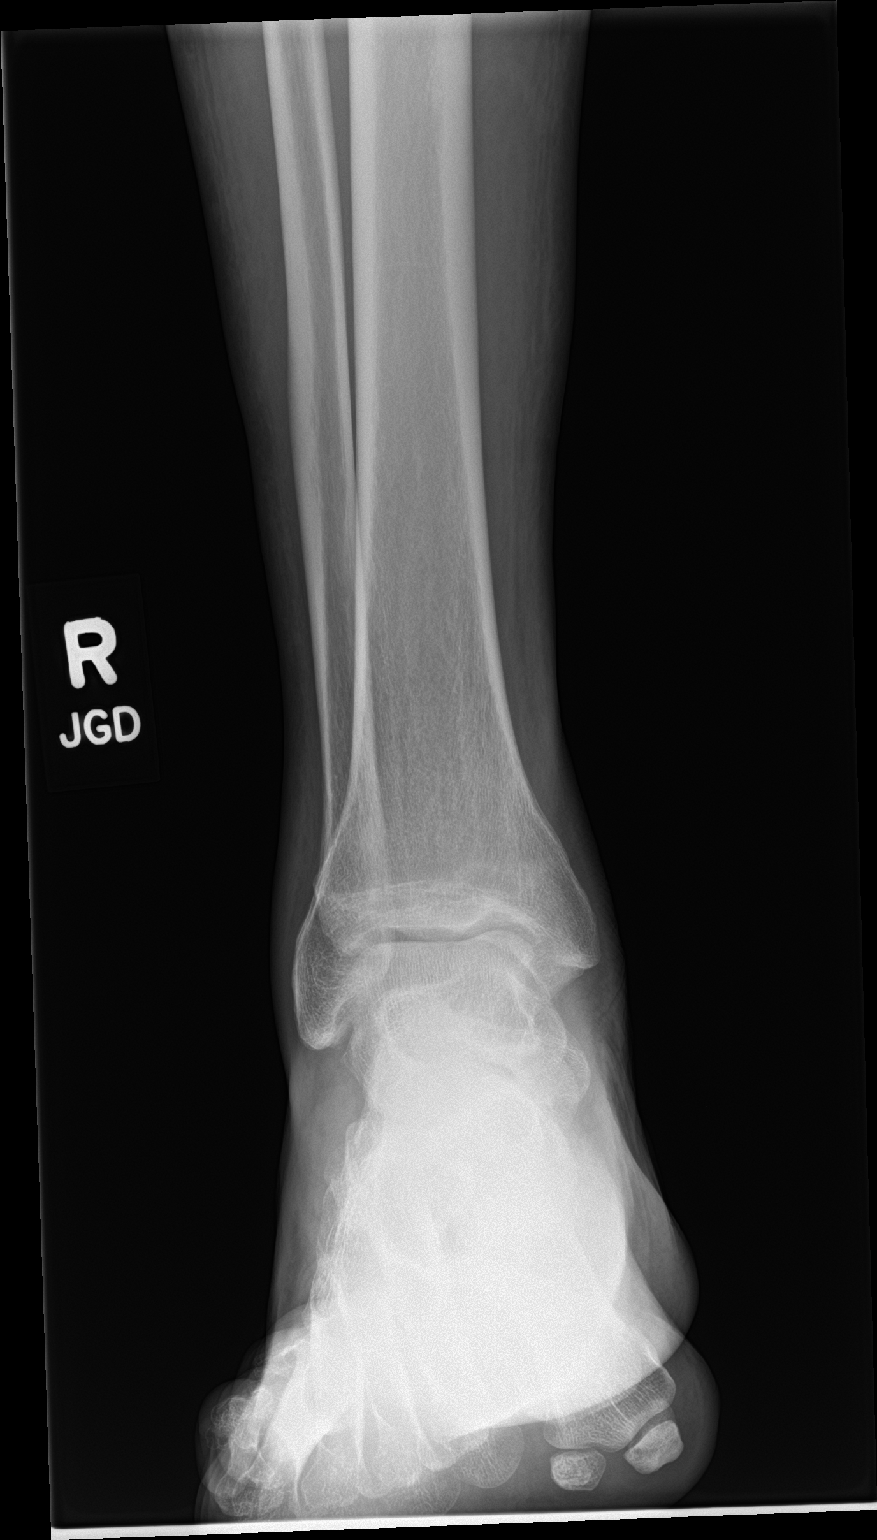

[ankle obl]
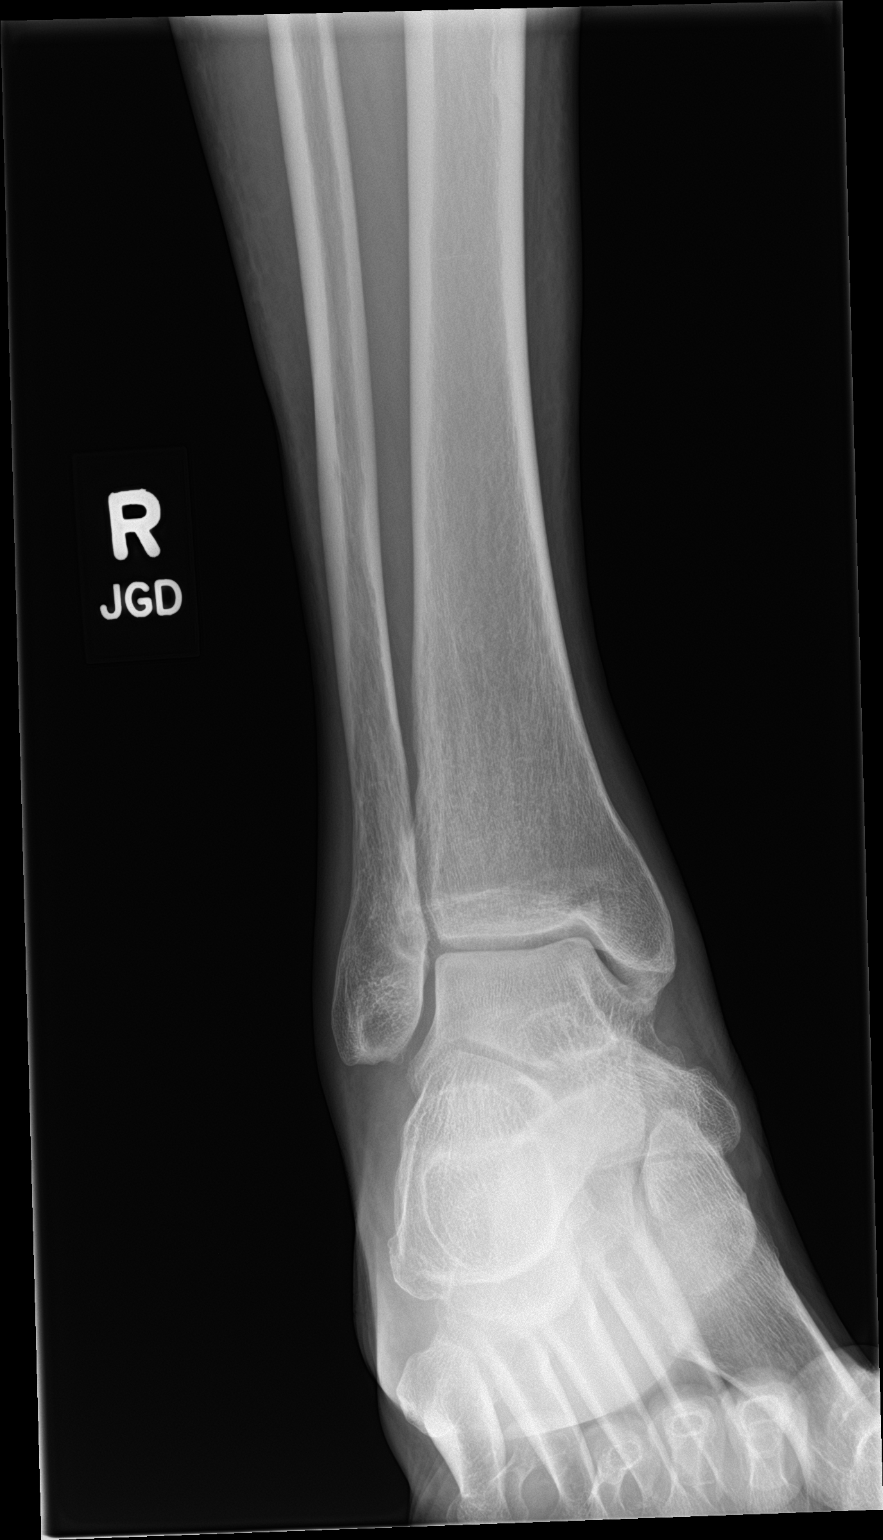

[ankle lat]
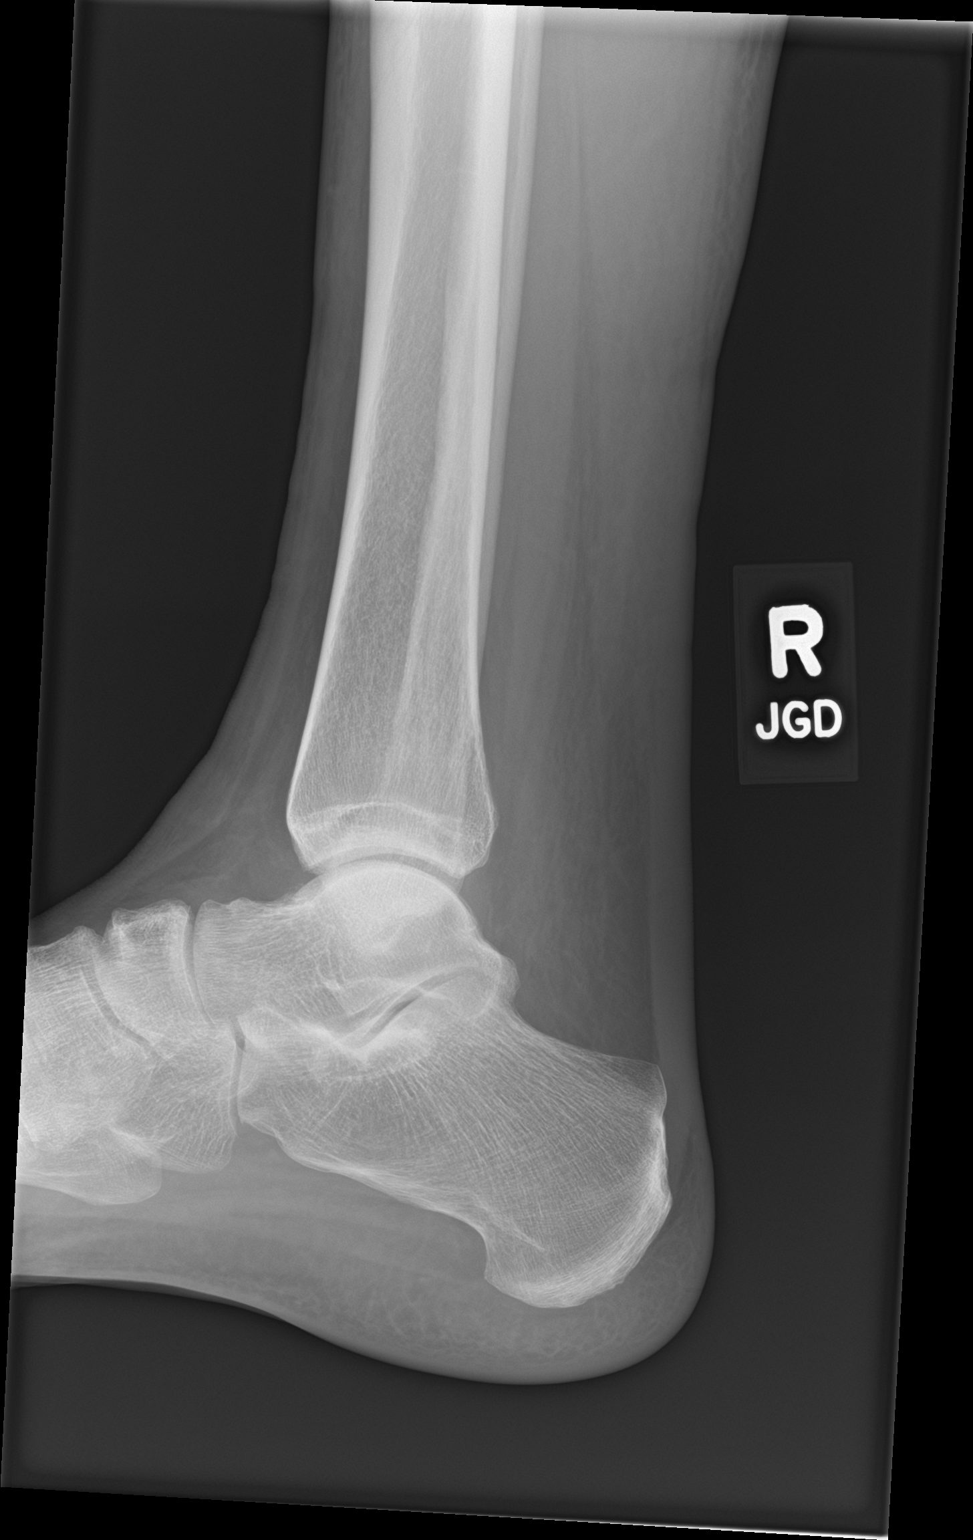

[3 of 3 positions shown; findings below may reference images not displayed]

FINDINGS: There is no evidence of fracture, dislocation, or joint effusion.
There is no evidence of arthropathy or other focal bone abnormality.
Soft tissues are unremarkable.
IMPRESSION: Negative.

## 2023-09-04 ENCOUNTER — Encounter: Admitting: Family Medicine

## 2024-02-04 ENCOUNTER — Encounter: Payer: Self-pay | Admitting: Family Medicine

## 2024-02-27 ENCOUNTER — Encounter: Admitting: Family Medicine

## 2024-03-03 ENCOUNTER — Encounter: Admitting: Family Medicine

## 2024-03-08 ENCOUNTER — Encounter: Admitting: Family Medicine

## 2024-03-17 ENCOUNTER — Encounter: Admitting: Family Medicine

## 2024-04-01 ENCOUNTER — Encounter: Payer: Self-pay | Admitting: Family Medicine

## 2024-04-01 ENCOUNTER — Ambulatory Visit (INDEPENDENT_AMBULATORY_CARE_PROVIDER_SITE_OTHER): Admitting: Family Medicine

## 2024-04-01 VITALS — BP 118/70 | HR 78 | Temp 98.4°F | Ht 77.0 in | Wt 206.0 lb

## 2024-04-01 DIAGNOSIS — Z125 Encounter for screening for malignant neoplasm of prostate: Secondary | ICD-10-CM | POA: Diagnosis not present

## 2024-04-01 DIAGNOSIS — Z0000 Encounter for general adult medical examination without abnormal findings: Secondary | ICD-10-CM

## 2024-04-01 DIAGNOSIS — N529 Male erectile dysfunction, unspecified: Secondary | ICD-10-CM

## 2024-04-01 DIAGNOSIS — Z23 Encounter for immunization: Secondary | ICD-10-CM

## 2024-04-01 DIAGNOSIS — L720 Epidermal cyst: Secondary | ICD-10-CM | POA: Diagnosis not present

## 2024-04-01 MED ORDER — SILDENAFIL CITRATE 50 MG PO TABS: 50.0000 mg | ORAL_TABLET | ORAL | 5 refills | Status: DC | PRN

## 2024-04-01 NOTE — Patient Instructions (Signed)
 A hamman glove can help with ingrown hairs or cysts.

## 2024-04-01 NOTE — Progress Notes (Signed)
 Established Patient Office Visit   Subjective  Patient ID: [name], male    DOB: [birth year]  Age: 58 y.o. MRN: [id number]  Chief Complaint  Patient presents with   Annual Exam    Patient is a 58 year old male seen for CPE.  Patient is not fasting.  Patient states he has been doing well overall.  Requesting refill on sildenafil .  Has an area on his back similar to prior abscess.  States area drained but seems to be healing.   Patient's 2 kids are doing well.  1 is currently in college and the other will be attending in [employer] next year.    Patient Active Problem List   Diagnosis Date Noted   Primary localized osteoarthritis of right hip 12/05/2017   History of total hip arthroplasty, right 12/05/2017   Past Medical History:  Diagnosis Date   High cholesterol    Stroke [facility])    Past Surgical History:  Procedure Laterality Date   TOTAL HIP ARTHROPLASTY Right 12/05/2017   Procedure: TOTAL HIP ARTHROPLASTY;  Surgeon: Shari Sieving, MD;  Location: [facility] OR;  Service: Orthopedics;  Laterality: Right;   Social History   Tobacco Use   Smoking status: Never    Passive exposure: Current   Smokeless tobacco: Never  Vaping Use   Vaping status: Never Used  Substance Use Topics   Alcohol use: Yes    Comment: SOCIALLY   Drug use: Not Currently   Family History  Problem Relation Age of Onset   Colon cancer Neg Hx    Colon polyps Neg Hx    Crohn's disease Neg Hx    Esophageal cancer Neg Hx    Rectal cancer Neg Hx    Stomach cancer Neg Hx    Ulcerative colitis Neg Hx    No Known Allergies  ROS Negative unless stated above    Objective:     BP 118/70 (BP Location: Left Arm, Patient Position: Sitting, Cuff Size: Large)   Pulse 78   Temp 98.4 F (36.9 C) (Oral)   Ht 6' 5 (1.956 m)   Wt 206 lb (93.4 kg)   SpO2 100%   BMI 24.43 kg/m  BP Readings from Last 3 Encounters:  04/01/24 118/70  12/30/22 125/83  12/12/22 120/88   Wt Readings from Last 3  Encounters:  04/01/24 206 lb (93.4 kg)  12/30/22 199 lb 8 oz (90.5 kg)  12/12/22 200 lb 3.2 oz (90.8 kg)      Physical Exam Constitutional:      Appearance: Normal appearance.  HENT:     Head: Normocephalic and atraumatic.     Right Ear: Tympanic membrane, ear canal and external ear normal.     Left Ear: Tympanic membrane, ear canal and external ear normal.     Nose: Nose normal.     Mouth/Throat:     Mouth: Mucous membranes are moist.     Pharynx: No oropharyngeal exudate or posterior oropharyngeal erythema.  Eyes:     General: No scleral icterus.    Extraocular Movements: Extraocular movements intact.     Conjunctiva/sclera: Conjunctivae normal.     Pupils: Pupils are equal, round, and reactive to light.  Neck:     Thyroid : No thyromegaly.     Vascular: No carotid bruit.  Cardiovascular:     Rate and Rhythm: Normal rate and regular rhythm.     Pulses: Normal pulses.     Heart sounds: Normal heart sounds. No murmur heard.    No  friction rub.  Pulmonary:     Effort: Pulmonary effort is normal.     Breath sounds: Normal breath sounds. No wheezing, rhonchi or rales.  Abdominal:     General: Bowel sounds are normal.     Palpations: Abdomen is soft.     Tenderness: There is no abdominal tenderness.  Musculoskeletal:        General: No deformity. Normal range of motion.  Lymphadenopathy:     Cervical: No cervical adenopathy.  Skin:    General: Skin is warm and dry.     Findings: No lesion.     Comments: L upper back/shoulder with area of eschar.  Purulent drainage and thick debris expressed.  Neurological:     General: No focal deficit present.     Mental Status: He is alert and oriented to person, place, and time.  Psychiatric:        Mood and Affect: Mood normal.        Thought Content: Thought content normal.        04/01/2024    4:00 PM 12/12/2022    8:10 AM 01/31/2022    8:41 AM  Depression screen PHQ 2/9  Decreased Interest 0 0 0  Down, Depressed, Hopeless  0 0 0  PHQ - 2 Score 0 0 0  Altered sleeping 0 0 2  Tired, decreased energy 0 0 0  Change in appetite 0 0   Feeling bad or failure about yourself  0 0 0  Trouble concentrating 0 0 0  Moving slowly or fidgety/restless 0 0 0  Suicidal thoughts 0 0 0  PHQ-9 Score 0 0 2  Difficult doing work/chores   Somewhat difficult      04/01/2024    4:01 PM 12/12/2022    8:10 AM  GAD 7 : Generalized Anxiety Score  Nervous, Anxious, on Edge 0 0  Control/stop worrying 0 0  Worry too much - different things 0 0  Trouble relaxing 0 0  Restless 0 0  Easily annoyed or irritable 0 0  Afraid - awful might happen 0 0  Total GAD 7 Score 0 0   Procedure note: Cyst drainage Epidermoid cyst L upper back with eschar.  Area cleaned.  No anesthetic used.  Drainage expressed manually and eschar lifted.  Copious amounts of purulent drainage/debris expressed.  Patient tolerated procedure well.  No results found for any visits on 04/01/24.    Assessment & Plan:   Well adult exam -     CBC with Differential/Platelet; Future -     Comprehensive metabolic panel with GFR; Future -     Hemoglobin A1c; Future -     Lipid panel; Future -     TSH; Future -     T4, free; Future -     Flu vaccine trivalent PF, 6mos and older(Flulaval,Afluria,Fluarix,Fluzone)  Need for influenza vaccination -     Flu vaccine trivalent PF, 6mos and older(Flulaval,Afluria,Fluarix,Fluzone)  Epidermoid cyst  Prostate cancer screening -     PSA; Future  Erectile dysfunction, unspecified erectile dysfunction type -     Sildenafil  Citrate; Take 1 tablet (50 mg total) by mouth as needed for erectile dysfunction.  Dispense: 10 tablet; Refill: 5  Age-appropriate health screenings discussed.  Obtain labs.  Immunizations reviewed.  Flu vaccine given this visit.  Colonoscopy up-to-date done 04/19/22.  3-year recall due 04/19/2025.  Consent obtained.  Epidermoid cyst debris expressed.  Patient tolerated procedure well.  Return if symptoms  worsen or fail to  improve.   Clotilda JONELLE Single, MD

## 2024-04-02 LAB — COMPREHENSIVE METABOLIC PANEL WITH GFR
AG Ratio: 1.7 (calc) (ref 1.0–2.5)
ALT: 17 U/L (ref 9–46)
AST: 17 U/L (ref 10–35)
Albumin: 4.8 g/dL (ref 3.6–5.1)
Alkaline phosphatase (APISO): 52 U/L (ref 35–144)
BUN/Creatinine Ratio: 23 (calc) — ABNORMAL HIGH (ref 6–22)
BUN: 31 mg/dL — ABNORMAL HIGH (ref 7–25)
CO2: 27 mmol/L (ref 20–32)
Calcium: 9.3 mg/dL (ref 8.6–10.3)
Chloride: 103 mmol/L (ref 98–110)
Creat: 1.37 mg/dL — ABNORMAL HIGH (ref 0.70–1.30)
Globulin: 2.8 g/dL (ref 1.9–3.7)
Glucose, Bld: 85 mg/dL (ref 65–99)
Potassium: 4.3 mmol/L (ref 3.5–5.3)
Sodium: 138 mmol/L (ref 135–146)
Total Bilirubin: 0.3 mg/dL (ref 0.2–1.2)
Total Protein: 7.6 g/dL (ref 6.1–8.1)
eGFR: 59 mL/min/1.73m2 — ABNORMAL LOW (ref >=60)

## 2024-04-02 LAB — CBC WITH DIFFERENTIAL/PLATELET
Absolute Lymphocytes: 1472 {cells}/uL (ref 850–3900)
Absolute Monocytes: 400 {cells}/uL (ref 200–950)
Basophils Absolute: 8 {cells}/uL (ref 0–200)
Basophils Relative: 0.2 %
Eosinophils Absolute: 108 {cells}/uL (ref 15–500)
Eosinophils Relative: 2.7 %
HCT: 45.2 % (ref 38.5–50.0)
Hemoglobin: 15.6 g/dL (ref 13.2–17.1)
MCH: 31.3 pg (ref 27.0–33.0)
MCHC: 34.5 g/dL (ref 32.0–36.0)
MCV: 90.6 fL (ref 80.0–100.0)
MPV: 9.4 fL (ref 7.5–12.5)
Monocytes Relative: 10 %
Neutro Abs: 2012 {cells}/uL (ref 1500–7800)
Neutrophils Relative %: 50.3 %
Platelets: 138 Thousand/uL — ABNORMAL LOW (ref 140–400)
RBC: 4.99 Million/uL (ref 4.20–5.80)
RDW: 13.2 % (ref 11.0–15.0)
Total Lymphocyte: 36.8 %
WBC: 4 Thousand/uL (ref 3.8–10.8)

## 2024-04-02 LAB — LIPID PANEL
Cholesterol: 200 mg/dL — ABNORMAL HIGH (ref <200)
HDL: 56 mg/dL (ref >=40)
LDL Cholesterol (Calc): 123 mg/dL — ABNORMAL HIGH
Non-HDL Cholesterol (Calc): 144 mg/dL — ABNORMAL HIGH (ref <130)
Total CHOL/HDL Ratio: 3.6 (calc) (ref <5.0)
Triglycerides: 100 mg/dL (ref <150)

## 2024-04-02 LAB — TSH: TSH: 1.08 m[IU]/L (ref 0.40–4.50)

## 2024-04-02 LAB — T4, FREE: Free T4: 1.2 ng/dL (ref 0.8–1.8)

## 2024-04-02 LAB — HEMOGLOBIN A1C
Hgb A1c MFr Bld: 5.2 % (ref <5.7)
Mean Plasma Glucose: 103 mg/dL
eAG (mmol/L): 5.7 mmol/L

## 2024-04-02 LAB — PSA: PSA: 0.9 ng/mL (ref <=4.00)

## 2024-04-29 ENCOUNTER — Ambulatory Visit: Payer: Self-pay | Admitting: Family Medicine

## 2024-04-29 DIAGNOSIS — R7989 Other specified abnormal findings of blood chemistry: Secondary | ICD-10-CM

## 2024-07-06 ENCOUNTER — Other Ambulatory Visit: Payer: Self-pay | Admitting: Family Medicine

## 2024-07-06 DIAGNOSIS — N529 Male erectile dysfunction, unspecified: Secondary | ICD-10-CM

## 2024-07-06 NOTE — Telephone Encounter (Unsigned)
 Copied from CRM [id number]. Topic: Clinical - Medication Refill >> Jul 06, 2024  3:41 PM Lauren C wrote: Medication: sildenafil  (VIAGRA ) 50 MG tablet  Has the patient contacted their pharmacy? No Requesting different pharmacy than previously used.  This is the patient's preferred pharmacy:  [id number] - [place], [place] - [address] AT [facility] [address] [place] [address] Phone: [phone] Fax: [phone]  Is this the correct pharmacy for this prescription? Yes If no, delete pharmacy and type the correct one.   Has the prescription been filled recently? Yes  Is the patient out of the medication? Yes  Has the patient been seen for an appointment in the last year OR does the patient have an upcoming appointment? Yes, last seen 04/01/24  Can we respond through MyChart? Yes  Agent: Please be advised that Rx refills may take up to 3 business days. We ask that you follow-up with your pharmacy.

## 2024-07-07 MED ORDER — SILDENAFIL CITRATE 50 MG PO TABS: 50.0000 mg | ORAL_TABLET | ORAL | 5 refills | Status: AC | PRN
